# Patient Record
Sex: Male | Born: 1935 | Race: White | Hispanic: No | Marital: Married | Smoking: Current every day smoker
Health system: Southern US, Community
[De-identification: ages and names within clinical notes are randomized; demographics above are authoritative.]

## PROBLEM LIST (undated history)

## (undated) DIAGNOSIS — F0390 Unspecified dementia without behavioral disturbance: Secondary | ICD-10-CM

## (undated) DIAGNOSIS — E119 Type 2 diabetes mellitus without complications: Secondary | ICD-10-CM

## (undated) DIAGNOSIS — G709 Myoneural disorder, unspecified: Secondary | ICD-10-CM

## (undated) DIAGNOSIS — R0600 Dyspnea, unspecified: Secondary | ICD-10-CM

## (undated) DIAGNOSIS — I10 Essential (primary) hypertension: Secondary | ICD-10-CM

## (undated) DIAGNOSIS — M1990 Unspecified osteoarthritis, unspecified site: Secondary | ICD-10-CM

## (undated) DIAGNOSIS — K219 Gastro-esophageal reflux disease without esophagitis: Secondary | ICD-10-CM

## (undated) DIAGNOSIS — J449 Chronic obstructive pulmonary disease, unspecified: Secondary | ICD-10-CM

## (undated) DIAGNOSIS — F209 Schizophrenia, unspecified: Secondary | ICD-10-CM

## (undated) DIAGNOSIS — F329 Major depressive disorder, single episode, unspecified: Secondary | ICD-10-CM

## (undated) DIAGNOSIS — I719 Aortic aneurysm of unspecified site, without rupture: Secondary | ICD-10-CM

## (undated) DIAGNOSIS — I2510 Atherosclerotic heart disease of native coronary artery without angina pectoris: Secondary | ICD-10-CM

## (undated) DIAGNOSIS — Z87442 Personal history of urinary calculi: Secondary | ICD-10-CM

## (undated) DIAGNOSIS — R519 Headache, unspecified: Secondary | ICD-10-CM

## (undated) DIAGNOSIS — F419 Anxiety disorder, unspecified: Secondary | ICD-10-CM

## (undated) DIAGNOSIS — R51 Headache: Secondary | ICD-10-CM

## (undated) DIAGNOSIS — I509 Heart failure, unspecified: Secondary | ICD-10-CM

## (undated) DIAGNOSIS — J189 Pneumonia, unspecified organism: Secondary | ICD-10-CM

## (undated) DIAGNOSIS — F32A Depression, unspecified: Secondary | ICD-10-CM

## (undated) HISTORY — PX: COLONOSCOPY: SHX174

## (undated) HISTORY — DX: Heart failure, unspecified: I50.9

## (undated) HISTORY — PX: FOOT FRACTURE SURGERY: SHX645

## (undated) HISTORY — PX: TONSILLECTOMY: SUR1361

## (undated) HISTORY — PX: VASCULAR SURGERY: SHX849

## (undated) HISTORY — PX: CATARACT EXTRACTION W/ INTRAOCULAR LENS IMPLANT: SHX1309

## (undated) HISTORY — PX: CHOLECYSTECTOMY: SHX55

## (undated) HISTORY — PX: CARDIAC CATHETERIZATION: SHX172

## (undated) HISTORY — PX: ADENOIDECTOMY: SUR15

---

## 1997-11-13 ENCOUNTER — Emergency Department (HOSPITAL_COMMUNITY): Admission: EM | Admit: 1997-11-13 | Discharge: 1997-11-13 | Payer: Self-pay | Admitting: Emergency Medicine

## 1998-05-10 ENCOUNTER — Encounter: Payer: Self-pay | Admitting: General Surgery

## 1998-05-11 ENCOUNTER — Observation Stay (HOSPITAL_COMMUNITY): Admission: RE | Admit: 1998-05-11 | Discharge: 1998-05-12 | Payer: Self-pay | Admitting: General Surgery

## 1999-09-07 ENCOUNTER — Encounter: Payer: Self-pay | Admitting: Emergency Medicine

## 1999-09-07 ENCOUNTER — Emergency Department (HOSPITAL_COMMUNITY): Admission: EM | Admit: 1999-09-07 | Discharge: 1999-09-07 | Payer: Self-pay | Admitting: Emergency Medicine

## 1999-09-14 ENCOUNTER — Ambulatory Visit (HOSPITAL_COMMUNITY): Admission: RE | Admit: 1999-09-14 | Discharge: 1999-09-14 | Payer: Self-pay | Admitting: Family Medicine

## 1999-09-21 ENCOUNTER — Encounter: Payer: Self-pay | Admitting: Cardiovascular Disease

## 1999-09-21 ENCOUNTER — Inpatient Hospital Stay (HOSPITAL_COMMUNITY): Admission: EM | Admit: 1999-09-21 | Discharge: 1999-09-22 | Payer: Self-pay | Admitting: Emergency Medicine

## 2000-01-31 ENCOUNTER — Ambulatory Visit (HOSPITAL_COMMUNITY): Admission: RE | Admit: 2000-01-31 | Discharge: 2000-02-01 | Payer: Self-pay | Admitting: *Deleted

## 2004-06-30 ENCOUNTER — Ambulatory Visit (HOSPITAL_COMMUNITY): Admission: RE | Admit: 2004-06-30 | Discharge: 2004-06-30 | Payer: Self-pay | Admitting: Family Medicine

## 2004-11-02 ENCOUNTER — Emergency Department (HOSPITAL_COMMUNITY): Admission: EM | Admit: 2004-11-02 | Discharge: 2004-11-02 | Payer: Self-pay | Admitting: Emergency Medicine

## 2004-11-05 ENCOUNTER — Emergency Department (HOSPITAL_COMMUNITY): Admission: EM | Admit: 2004-11-05 | Discharge: 2004-11-05 | Payer: Self-pay | Admitting: Emergency Medicine

## 2006-10-06 ENCOUNTER — Encounter: Admission: RE | Admit: 2006-10-06 | Discharge: 2006-10-06 | Payer: Self-pay | Admitting: Family Medicine

## 2007-09-17 ENCOUNTER — Inpatient Hospital Stay (HOSPITAL_COMMUNITY): Admission: EM | Admit: 2007-09-17 | Discharge: 2007-09-19 | Payer: Self-pay | Admitting: Emergency Medicine

## 2007-09-21 ENCOUNTER — Emergency Department (HOSPITAL_BASED_OUTPATIENT_CLINIC_OR_DEPARTMENT_OTHER): Admission: EM | Admit: 2007-09-21 | Discharge: 2007-09-21 | Payer: Self-pay | Admitting: Emergency Medicine

## 2008-04-28 ENCOUNTER — Encounter: Payer: Self-pay | Admitting: Emergency Medicine

## 2008-04-29 ENCOUNTER — Ambulatory Visit: Payer: Self-pay | Admitting: *Deleted

## 2008-04-29 ENCOUNTER — Inpatient Hospital Stay (HOSPITAL_COMMUNITY): Admission: RE | Admit: 2008-04-29 | Discharge: 2008-04-30 | Payer: Self-pay | Admitting: *Deleted

## 2008-05-04 ENCOUNTER — Emergency Department (HOSPITAL_COMMUNITY): Admission: EM | Admit: 2008-05-04 | Discharge: 2008-05-04 | Payer: Self-pay | Admitting: Emergency Medicine

## 2010-07-11 LAB — COMPREHENSIVE METABOLIC PANEL
ALT: 18 U/L (ref 0–53)
AST: 30 U/L (ref 0–37)
Albumin: 3.3 g/dL — ABNORMAL LOW (ref 3.5–5.2)
Alkaline Phosphatase: 81 U/L (ref 39–117)
BUN: 10 mg/dL (ref 6–23)
CO2: 32 mEq/L (ref 19–32)
Calcium: 8.8 mg/dL (ref 8.4–10.5)
Chloride: 99 mEq/L (ref 96–112)
Creatinine, Ser: 1.34 mg/dL (ref 0.4–1.5)
GFR calc Af Amer: >60 mL/min (ref >60)
GFR calc non Af Amer: 52 mL/min — ABNORMAL LOW (ref >60)
Glucose, Bld: 158 mg/dL — ABNORMAL HIGH (ref 70–99)
Potassium: 3.8 mEq/L (ref 3.5–5.1)
Sodium: 138 mEq/L (ref 135–145)
Total Bilirubin: 0.6 mg/dL (ref 0.3–1.2)
Total Protein: 6 g/dL (ref 6.0–8.3)

## 2010-07-11 LAB — DIFFERENTIAL
Band Neutrophils: 0 % (ref 0–10)
Basophils Absolute: 0 10*3/uL (ref 0.0–0.1)
Basophils Absolute: 0.1 10*3/uL (ref 0.0–0.1)
Basophils Relative: 0 % (ref 0–1)
Basophils Relative: 1 % (ref 0–1)
Blasts: 0 %
Eosinophils Absolute: 0.3 10*3/uL (ref 0.0–0.7)
Eosinophils Absolute: 0.4 10*3/uL (ref 0.0–0.7)
Eosinophils Relative: 3 % (ref 0–5)
Eosinophils Relative: 7 % — ABNORMAL HIGH (ref 0–5)
Lymphocytes Relative: 21 % (ref 12–46)
Lymphocytes Relative: 31 % (ref 12–46)
Lymphs Abs: 1.7 10*3/uL (ref 0.7–4.0)
Lymphs Abs: 1.9 10*3/uL (ref 0.7–4.0)
Metamyelocytes Relative: 0 %
Monocytes Absolute: 0.4 10*3/uL (ref 0.1–1.0)
Monocytes Absolute: 0.7 10*3/uL (ref 0.1–1.0)
Monocytes Relative: 8 % (ref 3–12)
Monocytes Relative: 8 % (ref 3–12)
Myelocytes: 0 %
Neutro Abs: 3 10*3/uL (ref 1.7–7.7)
Neutro Abs: 5.9 10*3/uL (ref 1.7–7.7)
Neutrophils Relative %: 54 % (ref 43–77)
Neutrophils Relative %: 66 % (ref 43–77)
Promyelocytes Absolute: 0 %
nRBC: 0 /100 WBC

## 2010-07-11 LAB — BASIC METABOLIC PANEL
BUN: 15 mg/dL (ref 6–23)
CO2: 27 mEq/L (ref 19–32)
Calcium: 10 mg/dL (ref 8.4–10.5)
Chloride: 105 mEq/L (ref 96–112)
Creatinine, Ser: 1.38 mg/dL (ref 0.4–1.5)
GFR calc Af Amer: >60 mL/min (ref >60)
GFR calc non Af Amer: 50 mL/min — ABNORMAL LOW (ref >60)
Glucose, Bld: 166 mg/dL — ABNORMAL HIGH (ref 70–99)
Potassium: 3.7 mEq/L (ref 3.5–5.1)
Sodium: 143 mEq/L (ref 135–145)

## 2010-07-11 LAB — CBC
HCT: 37.7 % — ABNORMAL LOW (ref 39.0–52.0)
HCT: 40.5 % (ref 39.0–52.0)
Hemoglobin: 12.6 g/dL — ABNORMAL LOW (ref 13.0–17.0)
Hemoglobin: 13.8 g/dL (ref 13.0–17.0)
MCHC: 33.3 g/dL (ref 30.0–36.0)
MCHC: 34.2 g/dL (ref 30.0–36.0)
MCV: 82.9 fL (ref 78.0–100.0)
MCV: 84.6 fL (ref 78.0–100.0)
Platelets: 265 10*3/uL (ref 150–400)
Platelets: 301 10*3/uL (ref 150–400)
RBC: 4.46 MIL/uL (ref 4.22–5.81)
RBC: 4.88 MIL/uL (ref 4.22–5.81)
RDW: 13 % (ref 11.5–15.5)
RDW: 14 % (ref 11.5–15.5)
WBC: 5.5 10*3/uL (ref 4.0–10.5)
WBC: 8.9 10*3/uL (ref 4.0–10.5)

## 2010-07-11 LAB — RAPID URINE DRUG SCREEN, HOSP PERFORMED
Amphetamines: NOT DETECTED
Amphetamines: NOT DETECTED
Barbiturates: NOT DETECTED
Barbiturates: NOT DETECTED
Benzodiazepines: POSITIVE — AB
Benzodiazepines: POSITIVE — AB
Cocaine: NOT DETECTED
Cocaine: NOT DETECTED
Opiates: NOT DETECTED
Opiates: NOT DETECTED
Tetrahydrocannabinol: NOT DETECTED
Tetrahydrocannabinol: NOT DETECTED

## 2010-07-11 LAB — URINALYSIS, ROUTINE W REFLEX MICROSCOPIC
Bilirubin Urine: NEGATIVE
Bilirubin Urine: NEGATIVE
Glucose, UA: NEGATIVE mg/dL
Glucose, UA: NEGATIVE mg/dL
Hgb urine dipstick: NEGATIVE
Hgb urine dipstick: NEGATIVE
Ketones, ur: NEGATIVE mg/dL
Ketones, ur: NEGATIVE mg/dL
Nitrite: NEGATIVE
Nitrite: NEGATIVE
Protein, ur: NEGATIVE mg/dL
Protein, ur: NEGATIVE mg/dL
Specific Gravity, Urine: 1.013 (ref 1.005–1.030)
Specific Gravity, Urine: 1.019 (ref 1.005–1.030)
Urobilinogen, UA: 1 mg/dL (ref 0.0–1.0)
Urobilinogen, UA: 1 mg/dL (ref 0.0–1.0)
pH: 6 (ref 5.0–8.0)
pH: 6 (ref 5.0–8.0)

## 2010-07-11 LAB — POCT CARDIAC MARKERS
CKMB, poc: 2.1 ng/mL (ref 1.0–8.0)
Myoglobin, poc: 182 ng/mL (ref 12–200)
Troponin i, poc: <0.05 ng/mL (ref 0.00–0.09)

## 2010-07-11 LAB — ETHANOL
Alcohol, Ethyl (B): 6 mg/dL (ref 0–10)
Alcohol, Ethyl (B): <5 mg/dL (ref 0–10)

## 2010-07-11 LAB — TRICYCLICS SCREEN, URINE: TCA Scrn: NOT DETECTED

## 2010-08-08 NOTE — Discharge Summary (Signed)
NAMEBEATRIZ, SETTLES                    ACCOUNT NO.:  [id number]   MEDICAL RECORD NO.:  [id number]          PATIENT TYPE:  IPS   LOCATION:  0300                          FACILITY:  [facility]   PHYSICIAN:  Margaret A. Scott, N.P.DATE OF BIRTH:  [birth year]   DATE OF ADMISSION:  04/29/2008  DATE OF DISCHARGE:  04/30/2008                               DISCHARGE SUMMARY   IDENTIFYING INFORMATION:  A 77 year old widowed Caucasian male.  This  was a voluntary admission.   HISTORY OF PRESENT ILLNESS:  First inpatient psychiatric admission for  this 64 years old whose wife died last week after three years of chronic  illness, he was the primary caregiver.  Apparently, he had talked with  his home health nurse, who referred him for admission and said that he  wished that he would be with his wife.  He denies that he has had any  plans to actually kill himself.  He admits being upset with his son  because his wife had a do not resuscitate order and that had been  apparently consented to by the son, and then when she had a cardiac  arrest, she was not resuscitated.  He says that he has made statement  that he wishes that he were with her, but says he did not intend that to  mean and that he has plans to kill himself.  He denies any plan to kill  himself or harm himself in any way.  Our clinical social worker have  spoken today with his home healthcare nurse, who was not feeling that he  was acutely suicidal but rather becoming more physically infirm, and  having difficulty caring for himself.  Today, his son agrees to take him  home with him and will make arrangements for him to go to assisted  living.  The patient denies any suicidal thoughts or thoughts of harming  himself in any way.  No history of substance abuse.   PAST PSYCHIATRIC HISTORY:  No history of previous psychiatric treatment.  No history of psychotropic medications.  No history of brain injury or  substance abuse.  No prior outpatient  treatment.   SOCIAL HISTORY:  Retired 77 year old widowed, on 02-03-2024his wife  died after suffering a pancreatic cancer for three years.  The patient  was the primary caregiver.  He has one adult son in town who is  supportive.  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  He is active in the [employer]  here in [place].  No legal problems.  He does have a home health  nurse that visits him, and help organize and track his medications.   MEDICAL HISTORY:  Primary care is at the [facility] in [place].  Medical problems are:  1. Dyslipidemia.  2. Coronary artery disease.  3. Gastroesophageal reflux disease.  4. Kidney stone.  5. Iliac artery aneurysm.  6. Urinary tract infection.  7. Skin lesions, not otherwise specified.   CURRENT MEDICATIONS:  1. Cyanocobalamin, that is, vitamin B12 500 mg two tablets daily.  2. Valacyclovir 500 mg one tablet t.i.d.  3. Temazepam 3 mg p.o. q.h.s.  4. Gabapentin 600 mg b.i.d.  5. Galantamine 4 mg one at bedtime.  6. Gemfibrozil 600 mg b.i.d.  7. Simvastatin 40 mg one-half tablet q.h.s.  8. ASA 81 mg daily.  9. Hydroxyzine 50 mg q.i.d. p.r.n. anxiety.  10.Venlafaxine XR 150 mg q.a.m. and q.h.s.  11.__________ eye drops one drop daily each eye.  12.Xanax 0.5 mg q.i.d. p.r.n. anxiety.  13.Tramadol 50 mg two tablets b.i.d. p.r.n. pain.   DRUG ALLERGIES:  No known drug allergies.   PHYSICAL EXAMINATION:  Physical exam was done in the emergency room.  This is a tall, medium-built Caucasian male in no distress, does  ambulate with a cane but has a stable gait.  Fully independent in all  his activities of daily living.  Vital signs are normal.  Full physical  exam was done in the emergency room and is noted in the record.    DIAGNOSTIC STUDIES:  Revealed normal urinalysis.  Chest x-ray reveals no  acute findings.  Urine drug screen positive for benzodiazepines.  CBC is  unremarkable.  Comprehensive metabolic panel and liver enzymes are  within normal limits.   MENTAL STATUS EXAM:  Fully alert male, pleasant, cooperative, gives a  coherent history, speech is normal, eye contact is good.  He is in full  contact with reality.  Speech is normal and paced tone production in  form.  He gives a coherent history.  Mood is neutral.  Thought process  logical and coherent.  Insight is good.  He reports that he is grieving  the loss of his wife, has no intent to harm himself.  No dangerous ideas  and thinking is linear.  Goal directed appropriate.  His concerns are  appropriate to the situation.  No evidence of psychosis, no delusional  statements made.  Cognition is preserved.  Immediate, recent, and remote  memory are intact.  Insight is good.  Impulse control and judgment  within normal limits.  AXIS I:  Major depression, recurrent, severe.  AXIS II:  No diagnosis.  AXIS III:  Hypertension, dyslipidemia, coronary artery disease.  AXIS IV:  Severe issues with recent death of his wife, in grief  reaction.  AXIS V:  Current 64, past year 62.   Estimated plan is to discharge him today.  His son is in agreement with  this.  He is coming to pick him up and will take him home with him, and  he has agreed to continue his followup with the [facility] in  [place].   No changes were made in his medications.  No new medications are  started.      Margaret A. Lorin Picket, N.P.     MAS/MEDQ  D:  04/30/2008  T:  04/30/2008  Job:  [id number]

## 2010-08-08 NOTE — Discharge Summary (Signed)
Chad Cameron, STRENGTH                    ACCOUNT NO.:  [id number]   MEDICAL RECORD NO.:  [id number]          PATIENT TYPE:  INP   LOCATION:  6730                         FACILITY:  [facility]   PHYSICIAN:  Michelene Gardener, MD    DATE OF BIRTH:  May 04, 1931   DATE OF ADMISSION:  09/17/2007  DATE OF DISCHARGE:  09/19/2007                               DISCHARGE SUMMARY   Her primary physician is Dr. Manson Cameron at [facility].   DISCHARGE DIAGNOSES:  1. Acute renal failure.  2. Orthostatic hypotension.  3. Urinary tract infection.  4. Hyperlipidemia.  5. Depression.  6. Anxiety.  7. History of coronary artery disease.  8. Frequent falls.  9. Hypertension.  10.Gastroesophageal reflux disease.  11.Kidney stones.   DISCHARGE MEDICATIONS:  The patient will be discharged on all  preadmission medications.  He does not know the dosages and he was  instructed to take them the way they were prescribed by his primary  physician.  Those medications include alprazolam, benazepril,  colchicine, doxazosin, gabapentin, metoprolol, Zocor, temazepam, and  Trazodone.   CONSULTATIONS:  None.   PROCEDURES:  None.   Follow-up appointment with primary doctor in 1-2 weeks.   RADIOLOGY STUDIES:  1. Chest x-ray on June 24 showed hyperinflation of the lungs.  2. CT scan of the abdomen without contrast on June 24 showed bilateral      nonobstructive renal calculi, renal aortic ectasia, bilateral lung      nodules including 6-mm right lower lobe lung nodule.  3. CT scan of the pelvis showed aneurysmal dilatation of the proximal      left internal iliac artery which is 1.5 cm.  CT scan of the head      without contrast on June 25 showed no acute findings.   COURSE OF HOSPITALIZATION:  1. Acute renal failure.  This patient had creatinine of 3.2 on the      time of admission.  His renal failure is most likely secondary to      ATN secondary to multiple factors including hypoperfusion from      hypotension in  addition to medications effect including benazepril      and colchicine in addition to UTI.  All those conditions were      treated.  He was given IV fluids to correct his hypotension and to      improve his renal perfusion.  He was given antibiotics to treat his      urinary tract infection.  His benazepril and colchicine were held.      His creatinine function was monitored in the hospital, and at the      time of discharge, it was back to normal at 1.16.  CT scan of the      abdomen and pelvis were done and they showed renal calculi which      has been present for long time without evidence of obstruction.      The patient was instructed to be treated medically for his renal      calculi.  2. Urinary tract infection.  His UA is showing a borderline UTI.  He      was given Rocephin while in the hospital.  He remains afebrile with      normal white counts.  No more antibiotics are recommended at the      time of discharge.  3. Orthostatic hypotension.  At the time of admission, his blood      pressure was in the 70s and he was dehydrated.  Antihypertensive      medications were held.  The patient was put on IV fluids.  At the      time of discharge, his blood pressure improved to 139/80.  That has      been without any antihypertensive medications and on IV fluids.  I      instructed him to stop his antihypertensive medication including      benazepril, metoprolol, and he will be seen and evaluated by his      primary doctor to restart them if appropriate.  4. Frequent falls.  This patient has had falls on and off for the last      2 years.  CT scan of this was done and it came to be normal.  The      patient is currently very normal.  His gait is back to his      baseline.  He was instructed to follow with his doctor.  Seen by PT      in the hospital and no further recommendations on him.  5. Lung nodule.  The patient had bilateral lung nodules seen in the CT      scan of the abdomen  and the largest is around 6 mm.  Recommendation      is to repeat CAT scan for followup in 6 months to 1 year.  The      patient was made aware about that.  6. Left internal iliac artery aneurysm.  The patient has a 1.5      aneurysm.  Again recommendation was to do a CTA.  I spoke to the      patient about this.  He does not want to do it and now he said he      will follow with his doctor to be done.   DISPOSITION:  Otherwise, other medical conditions remained stable in the  hospital.  The patient is in stable condition and does not have any  acute problems.  I walked him in his room and he did fine.  We will  discharge him home today and will hold his benazepril and his metoprolol   Total assessment time is 40 minutes.      Michelene Gardener, MD  Electronically Signed     NAE/MEDQ  D:  09/19/2007  T:  09/19/2007  Job:  [id number]   cc:   Dr. Manson Cameron at [facility]

## 2010-08-08 NOTE — H&P (Signed)
NAMEREACE, BRESHEARS NO.:  [id number]   MEDICAL RECORD NO.:  [id number]          PATIENT TYPE:  EMS   LOCATION:  MAJO                         FACILITY:  [facility]   PHYSICIAN:  Michelene Gardener, MD    DATE OF BIRTH:  [birth year]   DATE OF ADMISSION:  09/17/2007  DATE OF DISCHARGE:                              HISTORY & PHYSICAL   PRIMARY PHYSICIAN:  The patient has been followed at [facility] with  the [facility]. Will be admitted to the hospital as unassigned.   CHIEF COMPLAINT:  Frequent fall and fatigability.   HISTORY OF PRESENT ILLNESS:  This is a 77 year old Caucasian male with  past medical history of coronary artery disease, mild COPD,  hypertension, anxiety and GERD who presented with the above-mentioned  complaint.  Currently, the patient is very agitated. Minimal history can  be taken from him.  He is insisting of taking his Foley with and he does  not give very good history.  As per him, he has been his been having  poor balance for the last 6 months and he had very frequent falls.  In  the last few weeks, he has been falling almost every day, sometimes he  felt twice a day.  Today, this patient fell down. Did not feel any  dizziness or shortness of breath or chest pain prior to his fall.  His  wife called 9-1-1 because his falls have become very, very frequent.  When he came to the ER he was found to be hypotensive and he was also  orthostatic.  Laying down, his blood pressure was 81/49 and when he  stood up his blood pressure was 59/48.  He was also found to be in acute  renal failure.  The hospitalist service was called for further  evaluation.   PAST MEDICAL HISTORY:  1. Coronary artery disease status post cardiac catheterization in 2001      that showed moderate coronary artery disease in mid left anterior      descending artery with 50-60% stenosis.  At that time his ejection      fraction was 55%.  2. Hyperlipidemia.  3. Depression.  4.  Anxiety.  5. Hypertension.  6. Gastroesophageal reflux disease.  7. History of renal stones.   PAST SURGICAL HISTORY:  Denied.   ALLERGIES:  NO KNOWN DRUG ALLERGIES.   CURRENT MEDICATIONS:  The patient knows only the names but does not know  the dosage; medications include:  1. Alprazolam.  2. Benazepril.  3. Colchicine.  4. Doxazosin.  5. Gabapentin.  6. Galantamine.  7. Gemfibrozil.  8. Metoprolol tartrate.  9. Simvastatin.  10.Temazepam.  11.Tramadol/acetaminophen.  12.Trazodone.  13.Valtrex.   SOCIAL HISTORY:  The patient smokes around half pack of cigarettes per  day and has been smoking for a long time. He drinks around 2 ounces of  liquor per day since he cut down on his drinking.   FAMILY HISTORY:  Is positive for father with myocardial infarction.   REVIEW OF SYSTEMS:  As per HPI.   PHYSICAL EXAMINATION:  VITAL SIGNS: Temperature 97.7, blood pressure on  the time of admission is 81/49 laying down and 59/48 standing up, heart  rate 87, respiratory rate 22.  GENERAL APPEARANCE:  This is an elderly Caucasian male not in acute  distress.  HEENT: His conjunctivae are pink.  Pupils are equal, round and reactive  to light.  There is no ptosis. Hearing is intact. There is no ear  discharge or infection.  There is no nose infection or bleeding.  Oral  mucosa is dry.  No pharyngeal erythema.  NECK: Neck is supple.  No JVD, no carotid bruit, no lymphadenopathy and  no  thyroid enlargement or thyroid tenderness.  CARDIOVASCULAR:  S1, S2 regular. There is no murmurs, rubs or thrills.  RESPIRATORY:  The patient is breathing between 16-18.  There is no  rales, rhonchi and wheezes.  ABDOMEN:  Abdomen soft, not distended.  No tenderness. No  hepatosplenomegaly. Bowel sounds are normal.  LOWER EXTREMITIES: No edema, no rash and no varicose veins.  SKIN:  No rash and no erythema.  Skin turgor is very poor suggestive of  dehydration.  NEURO:  Cranial nerves are intact from II  through XII.  There is no  motor or sensory deficits.   LABORATORY DATA:  WBC 4.4, hemoglobin 11.6, hematocrit is 33.6, MCV  92.0, platelet count 138.  INR 1.1, PTT 29.  Lactic acid 1.4.  Sodium  137, potassium 5.1, chloride 108, bicarb 25, creatinine 3.2, glucose 80.  Urinalysis is borderline and it showed trace amount of leukocyte  esterase.  Urine WBC 3-6 and few bacteria.  Chest x-ray showed  hyperinflation with no acute findings.   IMPRESSION:  1. Acute renal failure.  2. Orthostatic hypotension.  3. Urinary tract infection.  4. Hyperlipidemia.  5. Depression.  6. Anxiety.  7. Coronary artery disease.  8. Hypertension.  9. Gastroesophageal reflux disease.  10.History of renal stones.   PLAN:  1. Acute renal failure.  Etiology is unclear at this point.  Might be      a combination of multiple factors.  His BUN over creatinine might      suggestive of post obstructive renal failure.  This patient already      had Foley catheter, but he is fighting to take the Foley out and he      is threatening to get the Foley out and leave the hospital.  I have      prolonged discussion with him to leave the Foley in, but he said      that he has been urinating well and he will not keep it.  I will      discontinue his Foley and will monitor his output very closely.  I      will hold medications that might affect his kidney and those      include benazepril, colchicine. His renal failure might be caused      by acute ATN secondary to hypotension so I will try to hydrate him      with IV fluids and then will follow his basic metabolic panel very      closely.  I will monitor his daily inputs and outputs.  Will get      urine, sodium, potassium, creatinine and protein.  The patient had      a CT of the abdomen and this is pending and I will follow the      results.  If creatinine continued to worsen then will consider  nephrology evaluation.  2. Hypotension, orthostatic hypotension.  I  will stop his      antihypertensive medication.  Will put him on normal saline.  I      will follow his blood pressure very carefully.  3. Urinary tract infection.  His urine is borderline.  Because of his      acute renal failure,  I will treat his urine infection with      Rocephin.  I will also get urine cultures.   DISPOSITION:  The patient does not know his medication dosages.  I will  order them when and they are available.  Otherwise, other medical  condition seems to be stable at the present.  Assessment time is 1 hour.      Michelene Gardener, MD  Electronically Signed     NAE/MEDQ  D:  09/17/2007  T:  09/17/2007  Job:  [id number]

## 2010-08-08 NOTE — Consult Note (Signed)
NAMEYADER, CRIGER                    ACCOUNT NO.:  [id number]   MEDICAL RECORD NO.:  [id number]          PATIENT TYPE:  IPS   LOCATION:  0300                          FACILITY:  [facility]   PHYSICIAN:  Antonietta Breach, M.D.  DATE OF BIRTH:  April 14, 1931   DATE OF CONSULTATION:  04/29/2008  DATE OF DISCHARGE:  04/30/2008                                 CONSULTATION   REQUESTING PHYSICIAN:  [facility].   REASON FOR CONSULTATION:  Psychosis.   HISTORY OF PRESENT ILLNESS:  Mr. Cuyler Vandyken is a 77 year old male who  presented to the [facility] with suicidal ideation.  Mr.  Hitchman has been grieving the loss of his wife.  She died on 05/07/2022 2010.  Since that time, Mr. Ruark has been developing paranoid delusions.  He has believed that there are people in his house who are not there.  He has had bizarre anger towards his son.  He has expressed suicidal  ideation and today has a plan to walk out in front of a car.  He does  have short-term memory impairment as well as impairment in judgment.  He  continues to state that he is going to walk out of the hospital.  He has  impairment in orientation other than place and self.   He has been receiving psychotropic medications as an outpatient  including Xanax 0.25 mg daily; Restoril 30 mg daily; trazodone 150 mg  q.h.s. and venlafaxine 150 mg b.i.d. However, his depression has  continued along with the development of the psychosis above.   PAST PSYCHIATRIC HISTORY:  Mr. Wyne does have a history of depression.  In review of the past medical record in June 2009 during a hospital  admission, depression and anxiety were both listed.  At that time, he  was being treated with Xanax as well as trazodone and temazepam   FAMILY PSYCHIATRIC HISTORY:  None known.   SOCIAL HISTORY:  Mr. Pieper has just become widowed on May 07, 2008.  He has been living by himself in his house.  Please see the above.  He  is not using any alcohol  or illegal drugs.  Occupation:  Retired.  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  He has one son.  He does have a history of drinking liquor in  the past drinking 2 ounces of liquor per day.  Mr. Siefert son has been  trying to get his financial affairs in order.   PAST MEDICAL HISTORY:  1. Coronary artery disease.  2. Hypertension.  3. Gastroesophageal reflux disease.  4. History of kidney stones.  5. Urinary tract infection.   MEDICATIONS:  His MAR is reviewed.  He has been taking the above  psychotropic medications.  However, he has not received any at the  hospital yet.   ALLERGIES:  NO KNOWN DRUG ALLERGIES.   LABORATORY DATA:  Urine drug screen was positive for benzodiazepines  which he is  prescribed.  WBC 5.5, hemoglobin 12.6, platelet count is  265, his MCV is 84.6, alcohol negative.  His SGOT 30, SGPT 18, albumin  3.3.   DIAGNOSTICS:  1. Head CT without contrast in June 2009 did show atrophy and      nonspecific white matter changes.  2. EKG QTC 493 milliseconds on April 28, 2008.   REVIEW OF SYSTEMS:  Constitutional, head, eyes, ears, nose, throat,  mouth, neurologic, psychiatric, cardiovascular, respiratory,  gastrointestinal, genitourinary, skin, musculoskeletal, hematologic,  lymphatic, endocrine, metabolic all unremarkable.   PHYSICAL EXAMINATION:  VITAL SIGNS:  Temperature 97.7, pulse 71,  respiratory rate 20, blood pressure 134/87, O2 saturation on room air  95%.  GENERAL APPEARANCE:  Mr. Mccorkel is an elderly male wandering the hallway  with no abnormal involuntary movements.   MENTAL STATUS EXAM:  Mr. Massman has a very anxious affect.  His mood is  anxious.  He is oriented to person.  He is alert.  His eye contact is  intact.  His attention span is mildly decreased.  Concentration is  mildly decreased.  Memory testing 3/3 immediate, 0/3 at 3 minutes.  His  fund of  knowledge and intelligence are below that of his estimated  premorbid baseline.  His speech involves normal rate and prosody without  dysarthria.  Thought process is coherent, however, illogical.  His  thought content involves paranoia.  He has poor insight and judgment.   ASSESSMENT:  AXIS I:  293.81, psychotic disorder not otherwise  specified.  293.83, mood disorder not otherwise specified, depressed, rule out major  depressive disorder with psychotic features.  Rule out dementia not  otherwise specified.  AXIS II:  None.  AXIS III:  See past medical history.  AXIS IV:  General medical, severe grief.  AXIS V:  20.   Mr. Marsiglia is at risk for suicide.  He also is at risk for lethal self-  neglect given his impaired memory and judgment.   RECOMMENDATIONS:  1. Would admit to a psychiatric inpatient unit as soon as possible for      further evaluation and treatment of his severe depression and      psychosis.  2. Would confirm that a reversible memory dysfunction etiology workup      has been conducted including checking a B12, folic acid, RPR and      TSH.  These have likely been conducted in the past given that he      has been on galantine.  The undersigned will order Ativan one-half      to 2 mg p.o. IM or IV q.4 h. p.r.n. severe agitation.  3. Also the undersigned will write for venlafaxine 37.5 mg b.i.d. to      reduce Mr. Yon withdrawal risk with venlafaxine.  However, will      not plan on increasing this dosage further given that he will      require a modification of his overall psychotropic treatment for      depression as well as treatment for psychosis.  4. The undersigned will defer antipsychotic treatment.  However, would      be cautious when an antipsychotic is      started regarding his QTC, rechecking the QTC after starting an      antipsychotic.  5. Would exercise caution when utilizing the Ativan regarding ataxia      and sedation.  6. Would continue ego  supportive psychotherapy and admit to an      inpatient psychiatric  ward as soon as possible.      Antonietta Breach, M.D.  Electronically Signed     JW/MEDQ  D:  04/30/2008  T:  05/01/2008  Job:  [id number]
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
## 2010-08-11 NOTE — Discharge Summary (Signed)
[place]. [facility]  Patient:    Chad Cameron, Chad Cameron                           MRN: [id number] Adm. Date:  [id number] Disc. Date: [id number] Attending:  Koren Bound CC:         Teena Irani. Arlyce Dice, M.D.             Rudene Christians. Ladona Ridgel, M.D. [facility]                           Discharge Summary  ADMITTING DIAGNOSIS:  Syncope with evidence of ventricular tachycardia.  DISCHARGE DIAGNOSES: 1. History of syncope. 2. Nonsustained ventricular tachycardia. 3. Mild to moderate coronary artery disease with normal left ventricular    systolic function. 4. Hypertension. 5. Anxiety.  DISCHARGE MEDICATIONS: 1. Toprol XL 100 mg a day. 2. Enteric-coated aspirin 325 mg a day. 3. Altace 5 mg a day.  The patient is to take nerve pills and sleeping pills as directed by his medical doctor.  DISPOSITION:  The patient has been instructed to stop smoking.  He is to eat a low-salt diet.  He is to watch his leg for any signs of bleeding.  He is to call Dr. Elease Hashimoto on Monday for a phone check-in.  He will see Dr. Elease Hashimoto for a stress Cardiolite study in two weeks.  HISTORY OF PRESENT ILLNESS:  Chad Cameron is a 77 year old gentleman with a history of syncope approximately two weeks ago.  Workup at that time was unremarkable.  The patient was referred to me for evaluation.  A Holter monitor revealed salvos of nonsustained ventricular tachycardia and he was admitted for further evaluation.  HOSPITAL COURSE: #1 - VENTRICULAR TACHYCARDIA:  The patient was placed on a monitor and had no episodes of ventricular tachycardia throughout the admission.  He remained quite stable.  #2 - QUESTION OF CORONARY ARTERY DISEASE:  The patient underwent heart catheterization.  He was found to have moderate coronary artery irregularities.  He did appear to have a ruptured plaque in the proximal left anterior descending artery that did not appear to be flow obstructive at this time.  He had a moderate 60%  stenosis in the distal LAD.  We will get a stress Cardiolite study to determine the functional status of this stenosis.  The left circumflex artery and the right coronary artery were within normal limits.  His left ventricular systolic function was normal.  There was no evidence of a previous myocardial infarction.  The patient will be discharged on enteric-coated aspirin and Toprol XL.  #3 - HYPERTENSION:  The patients blood pressure was 160/100 today.  We have already started him on Altace 5 mg a day.  We will add Toprol XL 100 mg a day. We will have him hold his Toprol several days prior to the stress test. DD:  09/22/99 TD:  09/23/99 Job: [id number]

## 2010-08-11 NOTE — Cardiovascular Report (Signed)
[place]. [facility]  Patient:    Chad Cameron, Chad Cameron                           MRN: [id number] Proc. Date: 01/31/00 Adm. Date:  [id number] Attending:  Daisey Must CC:         Teena Irani. Arlyce Dice, M.D.             Thomas C. Wall, M.D. [facility]  Cardiac Catheterization Laboratory                        Cardiac Catheterization  PROCEDURES PERFORMED: 1. Left heart catheterization with coronary angiography and left    ventriculography. 2. Intravascular ultrasound of the mid and proximal left anterior descending    artery.  INDICATIONS:  Mr. Giovanetti is a 77 year old male who has been having chest pain. He also had a syncopal episode.  A previous catheterization in June of this year showed moderate disease in the left anterior descending artery.  He has been treated medically but has continued to have symptoms of chest pain occurring on a very frequent basis.  He was therefore referred for repeat catheterization.  DESCRIPTION OF PROCEDURE:  A 6 French sheath was placed in the right femoral artery.  Further diagnostic catheterization we used Standard Judkins 6 French catheters and Omnipaque for contrast.  Following diagnostic catheterization we proceeded with intravascular ultrasound of the left anterior descending artery. We used a 6 [place] EBU 3.5 guiding catheter and a BMW wire. Intravascular ultrasound was performed with a Discovery Plus catheter, both automatic and manual pullback performed and intravascular ultrasound images recorded on video tape.  Intracoronary nitroglycerin was administered prior to performance of the intravascular ultrasound and again after removal of the IVUS catheter and coronary guidewire.  There were no complications.  RESULTS:  HEMODYNAMICS:  Left ventricular pressure 190/28.  Aorticpressure 184/90. There was no aortic valve gradient.  LEFT VENTRICULOGRAM:  Wall motion is normal to mildly hypokinetic globally. Ejection fraction estimated a  55-60%.  No mitral regurgitation.  CORONARYARTERIOGRAPHY:  (Right dominant).  Left main:  Normal.  Left anterior descending:  The left anterior descending artery has what appears to be a 30% stenosis angiographically in the proximal LAD at the bifurcation of a small first diagonal branch.  However, in some views it appears to be a napkin-ring type stenosis which may be more severe than appreciated.  In the mid LAD is a diffuse 40% stenosis.  In the distal LAD at thebifurcation just beyond the bifurcation of this diagonal is a 60% stenosis.  Further down in the distal LAD is a diffuse 60% stenosis.  The LAD gives rise to a small first diagonal, a normal sized second diagonal which shows a 60% stenosis at its origin and a large third diagonal which has a 50% stenosis at its origin.  Left circumflex:  The left circumflexhas a 30% stenosis in the proximal vessel and 20% stenosis in the mid vessel.  It gives rise to a small first marginal branch and a large second marginal branch.  The second marginal has a 30% stenosis.  Rightcoronary artery:  The right coronary artery has a 30% stenosis in the proximal vessel and a diffuse 30% stenosis in the mid vessel.  The distal right coronary artery gives rise to a normal sized posterior descending artery and a small posterolateral branch.  Intravascular ultrasound images of the mid and proximal LAD reveal very  mild atherosclerotic disease in the distal portion of the mid LAD.  In the proximal LAD at the area inquestion which on angiography appears to be possibly a napkin-ring type stenosis.  The lumen by intravascular ultrasound appears to be adequate.The minimal lumen diameter in this area is 3 mm with a reference diameter proximal and distal to this area of 4 to 4.5 mm.  This represents the 25-30% stenosis by intravascular ultrasound.  IMPRESSION: 1. Leftventricular systolic function in the lower range of normal. 2. Moderate one-vessel  coronary artery disease involving the left anterior    descending as described.  There is disease of borderline severity in the    distal left anterior descending involving the bifurcation of the large    third diagonal branch.  The disease in the proximal left anterior    descending does not appear to be hemodynamically significant by    intravascular ultrasound.  RECOMMENDATIONS:  For continued medical therapy. DD:  01/31/00 TD:  02/01/00 Job: [id number]

## 2010-12-21 LAB — CBC
HCT: 33.6 — ABNORMAL LOW
HCT: 43.6
Hemoglobin: 11.6 — ABNORMAL LOW
Hemoglobin: 15
MCHC: 34.5
MCHC: 34.4
MCV: 92
MCV: 90.8
Platelets: 138 — ABNORMAL LOW
Platelets: 222
RBC: 3.65 — ABNORMAL LOW
RBC: 4.8
RDW: 14.6
RDW: 14
WBC: 4.4
WBC: 6.8

## 2010-12-21 LAB — POCT CARDIAC MARKERS
CKMB, poc: 1.5
CKMB, poc: 8.5
CKMB, poc: <1 — ABNORMAL LOW
Myoglobin, poc: 240
Myoglobin, poc: 281
Myoglobin, poc: 61.7
Operator id: 146091
Operator id: 5507
Operator id: 5507
Troponin i, poc: <0.05
Troponin i, poc: <0.05
Troponin i, poc: <0.05

## 2010-12-21 LAB — URINALYSIS, ROUTINE W REFLEX MICROSCOPIC
Bilirubin Urine: NEGATIVE
Glucose, UA: NEGATIVE
Glucose, UA: NEGATIVE
Hgb urine dipstick: NEGATIVE
Hgb urine dipstick: NEGATIVE
Ketones, ur: NEGATIVE
Ketones, ur: 15 — AB
Nitrite: NEGATIVE
Nitrite: NEGATIVE
Protein, ur: NEGATIVE
Protein, ur: NEGATIVE
Specific Gravity, Urine: 1.022
Specific Gravity, Urine: 1.023
Urobilinogen, UA: 1
Urobilinogen, UA: 0.2
pH: 5
pH: 5.5

## 2010-12-21 LAB — BASIC METABOLIC PANEL
BUN: 10
BUN: 19
BUN: 8
CO2: 26
CO2: 28
CO2: 26
Calcium: 9.1
Calcium: 9.5
Calcium: 9.9
Chloride: 109
Chloride: 113 — ABNORMAL HIGH
Chloride: 107
Creatinine, Ser: 1.16
Creatinine, Ser: 1.87 — ABNORMAL HIGH
Creatinine, Ser: 1.2
GFR calc Af Amer: 43 — ABNORMAL LOW
GFR calc Af Amer: >60
GFR calc Af Amer: >60
GFR calc non Af Amer: 36 — ABNORMAL LOW
GFR calc non Af Amer: >60
GFR calc non Af Amer: 59 — ABNORMAL LOW
Glucose, Bld: 101 — ABNORMAL HIGH
Glucose, Bld: 124 — ABNORMAL HIGH
Glucose, Bld: 101 — ABNORMAL HIGH
Potassium: 4.4
Potassium: 5.3 — ABNORMAL HIGH
Potassium: 5.8 — ABNORMAL HIGH
Sodium: 143
Sodium: 147 — ABNORMAL HIGH
Sodium: 144

## 2010-12-21 LAB — DIFFERENTIAL
Basophils Absolute: 0
Basophils Absolute: 0.2 — ABNORMAL HIGH
Basophils Relative: 0
Basophils Relative: 3 — ABNORMAL HIGH
Eosinophils Absolute: 0.3
Eosinophils Absolute: 0.2
Eosinophils Relative: 8 — ABNORMAL HIGH
Eosinophils Relative: 2
Lymphocytes Relative: 20
Lymphocytes Relative: 18
Lymphs Abs: 0.9
Lymphs Abs: 1.2
Monocytes Absolute: 0.4
Monocytes Absolute: 0.5
Monocytes Relative: 10
Monocytes Relative: 7
Neutro Abs: 2.8
Neutro Abs: 4.6
Neutrophils Relative %: 62
Neutrophils Relative %: 70

## 2010-12-21 LAB — POCT I-STAT, CHEM 8
BUN: 25 — ABNORMAL HIGH
Calcium, Ion: 0.98 — ABNORMAL LOW
Chloride: 108
Creatinine, Ser: 3.2 — ABNORMAL HIGH
Glucose, Bld: 80
HCT: 33 — ABNORMAL LOW
Hemoglobin: 11.2 — ABNORMAL LOW
Potassium: 5.1
Sodium: 137
TCO2: 20

## 2010-12-21 LAB — LACTIC ACID, PLASMA: Lactic Acid, Venous: 1.4

## 2010-12-21 LAB — URINE MICROSCOPIC-ADD ON

## 2010-12-21 LAB — APTT: aPTT: 29

## 2010-12-21 LAB — PROTIME-INR
INR: 1.1
Prothrombin Time: 14.7

## 2012-03-03 DIAGNOSIS — G4730 Sleep apnea, unspecified: Secondary | ICD-10-CM | POA: Insufficient documentation

## 2012-03-03 DIAGNOSIS — R739 Hyperglycemia, unspecified: Secondary | ICD-10-CM | POA: Insufficient documentation

## 2012-03-03 DIAGNOSIS — I2510 Atherosclerotic heart disease of native coronary artery without angina pectoris: Secondary | ICD-10-CM | POA: Insufficient documentation

## 2012-03-03 DIAGNOSIS — F32A Depression, unspecified: Secondary | ICD-10-CM | POA: Insufficient documentation

## 2012-03-03 DIAGNOSIS — R55 Syncope and collapse: Secondary | ICD-10-CM | POA: Insufficient documentation

## 2012-03-03 DIAGNOSIS — R51 Headache: Secondary | ICD-10-CM | POA: Insufficient documentation

## 2012-03-03 DIAGNOSIS — N189 Chronic kidney disease, unspecified: Secondary | ICD-10-CM | POA: Insufficient documentation

## 2012-03-03 DIAGNOSIS — I712 Thoracic aortic aneurysm, without rupture, unspecified: Secondary | ICD-10-CM | POA: Insufficient documentation

## 2012-03-03 DIAGNOSIS — M797 Fibromyalgia: Secondary | ICD-10-CM | POA: Insufficient documentation

## 2012-03-03 DIAGNOSIS — E785 Hyperlipidemia, unspecified: Secondary | ICD-10-CM | POA: Insufficient documentation

## 2012-03-03 DIAGNOSIS — N400 Enlarged prostate without lower urinary tract symptoms: Secondary | ICD-10-CM | POA: Insufficient documentation

## 2012-03-03 DIAGNOSIS — J449 Chronic obstructive pulmonary disease, unspecified: Secondary | ICD-10-CM | POA: Insufficient documentation

## 2012-03-03 DIAGNOSIS — F419 Anxiety disorder, unspecified: Secondary | ICD-10-CM | POA: Insufficient documentation

## 2012-03-03 DIAGNOSIS — F0390 Unspecified dementia without behavioral disturbance: Secondary | ICD-10-CM | POA: Insufficient documentation

## 2012-03-03 DIAGNOSIS — R519 Headache, unspecified: Secondary | ICD-10-CM | POA: Insufficient documentation

## 2013-03-05 DIAGNOSIS — M25562 Pain in left knee: Secondary | ICD-10-CM | POA: Insufficient documentation

## 2013-03-05 DIAGNOSIS — M179 Osteoarthritis of knee, unspecified: Secondary | ICD-10-CM | POA: Insufficient documentation

## 2013-03-05 DIAGNOSIS — M1710 Unilateral primary osteoarthritis, unspecified knee: Secondary | ICD-10-CM | POA: Insufficient documentation

## 2014-02-10 DIAGNOSIS — I77810 Thoracic aortic ectasia: Secondary | ICD-10-CM | POA: Insufficient documentation

## 2016-07-16 DIAGNOSIS — E1159 Type 2 diabetes mellitus with other circulatory complications: Secondary | ICD-10-CM | POA: Insufficient documentation

## 2016-07-16 DIAGNOSIS — N521 Erectile dysfunction due to diseases classified elsewhere: Secondary | ICD-10-CM | POA: Insufficient documentation

## 2016-10-01 ENCOUNTER — Other Ambulatory Visit: Payer: Self-pay | Admitting: Neurological Surgery

## 2016-10-09 ENCOUNTER — Encounter (HOSPITAL_COMMUNITY)
Admission: RE | Admit: 2016-10-09 | Discharge: 2016-10-09 | Disposition: A | Discharge status: Home / Self Care | Payer: Medicare Other | Source: Ambulatory Visit | Attending: Neurological Surgery | Admitting: Neurological Surgery

## 2016-10-09 ENCOUNTER — Encounter (HOSPITAL_COMMUNITY): Payer: Self-pay

## 2016-10-09 DIAGNOSIS — E1169 Type 2 diabetes mellitus with other specified complication: Secondary | ICD-10-CM | POA: Insufficient documentation

## 2016-10-09 DIAGNOSIS — G8929 Other chronic pain: Secondary | ICD-10-CM | POA: Insufficient documentation

## 2016-10-09 DIAGNOSIS — E785 Hyperlipidemia, unspecified: Secondary | ICD-10-CM | POA: Diagnosis not present

## 2016-10-09 DIAGNOSIS — M5441 Lumbago with sciatica, right side: Secondary | ICD-10-CM | POA: Diagnosis not present

## 2016-10-09 DIAGNOSIS — Z01818 Encounter for other preprocedural examination: Secondary | ICD-10-CM | POA: Diagnosis not present

## 2016-10-09 HISTORY — DX: Unspecified dementia, unspecified severity, without behavioral disturbance, psychotic disturbance, mood disturbance, and anxiety: F03.90

## 2016-10-09 HISTORY — DX: Anxiety disorder, unspecified: F41.9

## 2016-10-09 HISTORY — DX: Headache: R51

## 2016-10-09 HISTORY — DX: Personal history of urinary calculi: Z87.442

## 2016-10-09 HISTORY — DX: Essential (primary) hypertension: I10

## 2016-10-09 HISTORY — DX: Type 2 diabetes mellitus without complications: E11.9

## 2016-10-09 HISTORY — DX: Chronic obstructive pulmonary disease, unspecified: J44.9

## 2016-10-09 HISTORY — DX: Gastro-esophageal reflux disease without esophagitis: K21.9

## 2016-10-09 HISTORY — DX: Depression, unspecified: F32.A

## 2016-10-09 HISTORY — DX: Headache, unspecified: R51.9

## 2016-10-09 HISTORY — DX: Major depressive disorder, single episode, unspecified: F32.9

## 2016-10-09 HISTORY — DX: Aortic aneurysm of unspecified site, without rupture: I71.9

## 2016-10-09 HISTORY — DX: Myoneural disorder, unspecified: G70.9

## 2016-10-09 HISTORY — DX: Atherosclerotic heart disease of native coronary artery without angina pectoris: I25.10

## 2016-10-09 HISTORY — DX: Unspecified osteoarthritis, unspecified site: M19.90

## 2016-10-09 LAB — BASIC METABOLIC PANEL
Anion gap: 8 (ref 5–15)
BUN: 17 mg/dL (ref 6–20)
CO2: 23 mmol/L (ref 22–32)
Calcium: 9.1 mg/dL (ref 8.9–10.3)
Chloride: 107 mmol/L (ref 101–111)
Creatinine, Ser: 1.13 mg/dL (ref 0.61–1.24)
GFR calc Af Amer: >60 mL/min (ref >60)
GFR calc non Af Amer: 59 mL/min — ABNORMAL LOW (ref >60)
Glucose, Bld: 141 mg/dL — ABNORMAL HIGH (ref 65–99)
Potassium: 4.5 mmol/L (ref 3.5–5.1)
Sodium: 138 mmol/L (ref 135–145)

## 2016-10-09 LAB — CBC WITH DIFFERENTIAL/PLATELET
Basophils Absolute: 0 10*3/uL (ref 0.0–0.1)
Basophils Relative: 0 %
Eosinophils Absolute: 0.1 10*3/uL (ref 0.0–0.7)
Eosinophils Relative: 1 %
HCT: 38 % — ABNORMAL LOW (ref 39.0–52.0)
Hemoglobin: 12.6 g/dL — ABNORMAL LOW (ref 13.0–17.0)
Lymphocytes Relative: 17 %
Lymphs Abs: 1.5 10*3/uL (ref 0.7–4.0)
MCH: 27.5 pg (ref 26.0–34.0)
MCHC: 33.2 g/dL (ref 30.0–36.0)
MCV: 82.8 fL (ref 78.0–100.0)
Monocytes Absolute: 0.4 10*3/uL (ref 0.1–1.0)
Monocytes Relative: 5 %
Neutro Abs: 7.1 10*3/uL (ref 1.7–7.7)
Neutrophils Relative %: 77 %
Platelets: 225 10*3/uL (ref 150–400)
RBC: 4.59 MIL/uL (ref 4.22–5.81)
RDW: 14.6 % (ref 11.5–15.5)
WBC: 9.2 10*3/uL (ref 4.0–10.5)

## 2016-10-09 LAB — GLUCOSE, CAPILLARY: Glucose-Capillary: 154 mg/dL — ABNORMAL HIGH (ref 65–99)

## 2016-10-09 LAB — PROTIME-INR
INR: 1.02
Prothrombin Time: 13.4 seconds (ref 11.4–15.2)

## 2016-10-09 NOTE — Pre-Procedure Instructions (Signed)
Lois HuxleyCoy Pileggi  10/09/2016     No Pharmacies Listed   Your procedure is scheduled on October 11, 2016.  [facility] 1:00 P.M  Call this number if you have problems the morning of surgery:  [phone]   Remember:  Do not eat food or drink liquids after midnight.  Take these medicines the morning of surgery with A SIP OF WATER: Amlodipine (norvasc) Docusate sodium (Colace) Duloxetine (Cymbalta) Gabapentin (Neurontin) Hydrocodone-acetaminophen (Norco/vicodin) Omeprazole (Prilosec) Pantoprazole (Protonix) Pregabalin (Lyrica)  7 days prior to surgery STOP taking any Aspirin, Aleve, Naproxen, Ibuprofen, Motrin, Advil, Goody's, BC's, all herbal medications, fish oil, and all vitamins   How to Manage Your Diabetes Before and After Surgery  Why is it important to control my blood sugar before and after surgery? . Improving blood sugar levels before and after surgery helps healing and can limit problems. . A way of improving blood sugar control is eating a healthy diet by: o  Eating less sugar and carbohydrates o  Increasing activity/exercise o  Talking with your doctor about reaching your blood sugar goals . High blood sugars (greater than 180 mg/dL) can raise your risk of infections and slow your recovery, so you will need to focus on controlling your diabetes during the weeks before surgery. . Make sure that the doctor who takes care of your diabetes knows about your planned surgery including the date and location.  How do I manage my blood sugar before surgery? . Check your blood sugar at least 4 times a day, starting 2 days before surgery, to make sure that the level is not too high or low. o Check your blood sugar the morning of your surgery when you wake up and every 2 hours until you get to the Short Stay unit. . If your blood sugar is less than 70 mg/dL, you will need to treat for low blood sugar: o Do not take insulin. o Treat a low blood sugar  (less than 70 mg/dL) with  cup of clear juice (cranberry or apple), 4 glucose tablets, OR glucose gel. o Recheck blood sugar in 15 minutes after treatment (to make sure it is greater than 70 mg/dL). If your blood sugar is not greater than 70 mg/dL on recheck, call [phone] for further instructions. . Report your blood sugar to the short stay nurse when you get to Short Stay.  . If you are admitted to the hospital after surgery: o Your blood sugar will be checked by the staff and you will probably be given insulin after surgery (instead of oral diabetes medicines) to make sure you have good blood sugar levels. o The goal for blood sugar control after surgery is 80-180 mg/dL.   WHAT DO I DO ABOUT MY DIABETES MEDICATION?   Marland Kitchen. Do not take oral diabetes medicines (pills) the morning of surgery.  . THE NIGHT BEFORE SURGERY, take _25 units of _Lantus__________insulin.      . If your CBG is greater than 220 mg/dL, you may take  of your sliding scale (correction) dose of insulin.  O  Do not wear jewelry, make-up or nail polish.  Do not wear lotions, powders, or perfumes, or deoderant.  Do not shave 48 hours prior to surgery.  Men may shave face and neck.  Do not bring valuables to the hospital.  [facility] is not responsible for any belongings or valuables.  Contacts, dentures or bridgework may not be worn into surgery.  Leave your  suitcase in the car.  After surgery it may be brought to your room.  For patients admitted to the hospital, discharge time will be determined by your treatment team.  Patients discharged the day of surgery will not be allowed to drive home.   Name and phone number of your driver:    Special instructions:   [address]- Preparing For Surgery  Before surgery, you can play an important role. Because skin is not sterile, your skin needs to be as free of germs as possible. You can reduce the number of germs on your skin by washing with CHG (chlorahexidine  gluconate) Soap before surgery.  CHG is an antiseptic cleaner which kills germs and bonds with the skin to continue killing germs even after washing.  Please do not use if you have an allergy to CHG or antibacterial soaps. If your skin becomes reddened/irritated stop using the CHG.  Do not shave (including legs and underarms) for at least 48 hours prior to first CHG shower. It is OK to shave your face.  Please follow these instructions carefully.   1. Shower the NIGHT BEFORE SURGERY and the MORNING OF SURGERY with CHG.   2. If you chose to wash your hair, wash your hair first as usual with your normal shampoo.  3. After you shampoo, rinse your hair and body thoroughly to remove the shampoo.  4. Use CHG as you would any other liquid soap. You can apply CHG directly to the skin and wash gently with a scrungie or a clean washcloth.   5. Apply the CHG Soap to your body ONLY FROM THE NECK DOWN.  Do not use on open wounds or open sores. Avoid contact with your eyes, ears, mouth and genitals (private parts). Wash genitals (private parts) with your normal soap.  6. Wash thoroughly, paying special attention to the area where your surgery will be performed.  7. Thoroughly rinse your body with warm water from the neck down.  8. DO NOT shower/wash with your normal soap after using and rinsing off the CHG Soap.  9. Pat yourself dry with a CLEAN TOWEL.   10. Wear CLEAN PAJAMAS   11. Place CLEAN SHEETS on your bed the night of your first shower and DO NOT SLEEP WITH PETS.    Day of Surgery: Do not apply any deodorants/lotions. Please wear clean clothes to the hospital/surgery center.      Please read over the following fact sheets that you were given. Pain Booklet, MRSA Information and Surgical Site Infection Prevention

## 2016-10-09 NOTE — Progress Notes (Signed)
PCP - Dr. Marina GoodellPerry, Chad DenverZelda Cameron Cardiologist - patient unsure  Chest x-ray - 07/05/2016 EKG - 07/05/2016 Stress Test -05/11/2010  ECHO - 02/10/2017 Cardiac Cath - 02/09/2017  Sleep Study - patient denies  Fasting Blood Sugar - 150-180 now that his insulin regimen was changed, prior it was 250-280 Checks Blood Sugar 1 time a day every 3 days due to supply problems    Patient denies shortness of breath, fever, cough and chest pain at PAT appointment   Patient verbalized understanding of instructions that were given to them at the PAT appointment. Patient was also instructed that they will need to review over the PAT instructions again at home before surgery.

## 2016-10-10 ENCOUNTER — Encounter (HOSPITAL_COMMUNITY): Payer: Self-pay | Admitting: Emergency Medicine

## 2016-10-10 ENCOUNTER — Encounter (HOSPITAL_COMMUNITY): Payer: Self-pay | Admitting: Anesthesiology

## 2016-10-10 LAB — SURGICAL PCR SCREEN
MRSA, PCR: POSITIVE — AB
Staphylococcus aureus: POSITIVE — AB

## 2016-10-10 NOTE — Progress Notes (Addendum)
Anesthesia Chart Review:  Pt is an 83 year old male scheduled for L5-S1 laminectomy and foraminotomy on 10/11/2016 with Marikay Alaravid Jones, MD  - PCP is Bertram DenverZelda Fleming, NP (notes in care everywhere)  - Pt reports has not seen cardiology in years (? last visit 03/22/14 in care everywhere).   PMH includes:  CAD (diffuse disease by 01/2014 cath), dilated aortic root (4.7cm by 02/10/14 echo), HTN, DM, aortic aneurysm (infrarenal 3.7 x 3.6cm 04/16/16), COPD, pulmonary nodules (04/16/16), dementia. GERD.  Current smoker. BMI 25.5  - ED visit 07/05/16 (care everywhere) for right sided chest pain.  Negative troponin, negative d-dimer, no acute findings on EKG, negative CXR.  Felt to be non-cardiac.   Medications include: amlodipine, ASA 81mg , lipitor, aricept, novolog, lantus, lisinopril, prilosec, protonix, sildenafil - Pt's PCP notes lists pt is taking plavix but he denies this. Uncertain when he stopped taking it.   BP 136/65   Pulse 96   Temp 36.4 C   Resp 20   Ht 6' (1.829 m)   Wt 188 lb 12.8 oz (85.6 kg)   SpO2 95%   BMI 25.61 kg/m    Preoperative labs reviewed.  Glucose 141. HbA1c was 9.6 on 09/28/16 (care everywhere).   1 view CXR 07/05/16 (care everywhere): No acute cardiopulmonary process. Unchanged mildly enlarged cardiomediastinal silhouette  EKG 07/05/16 [facility]): NSR. LAD. Incomplete RBBB. Nonspecific ST abnormality.  CT angio aortogram 04/16/16 (care everywhere):   1. Small infrarenal abdominal aortic aneurysm measuring 3.7 x 3.6 cm in the corrected plane with no complicating features. Mild aneurysmal dilatation right common iliac artery and left internal iliac artery with no acute abnormalities. 2. 50% stenosis proximal left main renal artery. 3. Bilateral renal cysts, tiny nonobstructing right upper pole renal stones, duodenal diverticula, colonic diverticulosis, hiatal hernia, spondylosis, mild compression T12 vertebral body, and right inguinal hernia again  noted. 4. 7 mm nodule right lung base and 4 mm nodule left lung base.  - I do not see that PCP is aware of CT angio results including lung nodules that need close f/u.    Echo 02/10/14 (care everywhere): 1. LV size normal. LV systolic function is low normal. EF 50-55%. LV filling pattern is impaired.  2. RV is normal in size and function. 3. There is aortic valve sclerosis. Mild aortic regurgitation. 4. Mildly dilated aortic arch. 5. Mild to moderately dilated aortic root-4.7cm.  significant stenosis seen 6. There is no pericardial effusion.  Cardiac cath 02/09/14 (care everywhere):  1. Mid LAD 70% stenosis. D2 90% stenosis. D3 90% stenosis. 2. OM 2 30% stenosis 3. Mid RCA 50% stenosis. Ectatic aneurysm distal to the 50% lesion. CONCLUSIONS: obstructive 1 vessel. EF 55%. Wall motion normal.  Other notes: LCP for mild Tn leak and SOB. Markedly hypertensive to near 200's throughout case despite Hydralazine. 70% mLAD at bifurcataion of D3 which is a significant vessel - that D3 has 90% far distal. Moderately calcified.90% D2 but small vessel, mild non-obs LCX/OM disease. RCA ectasia with aneurysm distal to 50% lesion.LVEF 55%. EBL 10mL, No specimens. TR for hemostasis.Lesion is notably calcific, and appears chronic in nature. Diffuse disease - not clear that this is the source of his symptoms. Suggest aggressive medical therapy, and if cont'd sx, can consider PCI at a future date.  Reviewed case with Dr. Noreene LarssonJoslin.  Pt will need stress test prior to surgery. I notified Nikki in Dr. Yetta BarreJones' office.   I also notified Stacy in PCP's office that pt is not on  plavix as prescribed and has pulmonary nodules on CT from 04/16/16 have not received f/u as recommended (as far as I can determine).   Rica Mast, FNP-BC [facility]/Anesthesiology Phone: [phone] 10/10/2016 11:24 AM

## 2016-10-11 ENCOUNTER — Encounter (HOSPITAL_COMMUNITY): Admission: RE | Payer: Self-pay | Source: Ambulatory Visit

## 2016-10-11 ENCOUNTER — Inpatient Hospital Stay (HOSPITAL_COMMUNITY): Admission: RE | Admit: 2016-10-11 | Payer: Medicare Other | Source: Ambulatory Visit | Admitting: Neurological Surgery

## 2016-10-11 SURGERY — LUMBAR LAMINECTOMY/DECOMPRESSION MICRODISCECTOMY 1 LEVEL
Anesthesia: General | Site: Back | Laterality: Right

## 2016-10-31 ENCOUNTER — Other Ambulatory Visit: Payer: Self-pay | Admitting: Neurological Surgery

## 2016-11-01 ENCOUNTER — Encounter (HOSPITAL_COMMUNITY): Payer: Self-pay | Admitting: *Deleted

## 2016-11-01 ENCOUNTER — Other Ambulatory Visit: Payer: Self-pay | Admitting: Neurological Surgery

## 2016-11-01 MED ORDER — DEXAMETHASONE SODIUM PHOSPHATE 10 MG/ML IJ SOLN
10.0000 mg | INTRAMUSCULAR | Status: AC
  Administered 2016-11-02: 10 mg via INTRAVENOUS
  Filled 2016-11-01: qty 1

## 2016-11-01 MED ORDER — CEFAZOLIN SODIUM-DEXTROSE 2-4 GM/100ML-% IV SOLN
2.0000 g | INTRAVENOUS | Status: AC
  Administered 2016-11-02: 2 g via INTRAVENOUS
  Filled 2016-11-01: qty 100

## 2016-11-01 NOTE — Progress Notes (Signed)
Anesthesia Chart Review:  Pt is a same day work up.   Pt is an 83 year old male scheduled for L5-S1 laminectomy and foraminotomy on 11/02/2016 with Marikay Alaravid Jones, MD  Surgery was originally scheduled for 10/11/2016, pt was postponed so pt could be evaluated by cardiology prior to surgery (cardiology follow up was overdue by 3 years).   - PCP is Bertram DenverZelda Fleming, NP (notes in care everywhere)  - Cardiologist is W. Viann FishSpencer Tilley, MD, who cleared pt for surgery at last office visit 10/15/16  PMH includes:  CAD (diffuse disease by 01/2014 cath), dilated aortic root (4.7cm by 02/10/14 echo), HTN, DM, aortic aneurysm (infrarenal 3.7 x 3.6cm 04/16/16), COPD, pulmonary nodules (04/16/16), dementia. GERD.  Current smoker. BMI 25.5  - ED visit 07/05/16 (care everywhere) for right sided chest pain.  Negative troponin, negative d-dimer, no acute findings on EKG, negative CXR.  Felt to be non-cardiac.   Medications include: amlodipine, ASA 81mg , lipitor, aricept, novolog, lantus, lisinopril, prilosec, protonix, sildenafil   Preoperative labs will be obtained DOS.  HbA1c was 9.6 on 09/28/16 (care everywhere).   1 view CXR 07/05/16 (care everywhere): No acute cardiopulmonary process. Unchanged mildly enlarged cardiomediastinal silhouette  EKG 07/05/16 [facility]): NSR. LAD. Incomplete RBBB. Nonspecific ST abnormality.  CT angio aortogram 04/16/16 (care everywhere):   1. Small infrarenal abdominal aortic aneurysm measuring 3.7 x 3.6 cm in the corrected plane with no complicating features. Mild aneurysmal dilatation right common iliac artery and left internal iliac artery with no acute abnormalities. 2. 50% stenosis proximal left main renal artery. 3. Bilateral renal cysts, tiny nonobstructing right upper pole renal stones, duodenal diverticula, colonic diverticulosis, hiatal hernia, spondylosis, mild compression T12 vertebral body, and right inguinal hernia again noted. 4. 7 mm nodule  right lung base and 4 mm nodule left lung base.  - I do not see that PCP is aware of CT angio results including lung nodules that need close f/u.    Echo 02/10/14 (care everywhere): 1. LV size normal. LV systolic function is low normal. EF 50-55%. LV filling pattern is impaired.  2. RV is normal in size and function. 3. There is aortic valve sclerosis. Mild aortic regurgitation. 4. Mildly dilated aortic arch. 5. Mild to moderately dilated aortic root-4.7cm.  significant stenosis seen 6. There is no pericardial effusion.  Cardiac cath 02/09/14 (care everywhere):  1. Mid LAD 70% stenosis. D2 90% stenosis. D3 90% stenosis. 2. OM 2 30% stenosis 3. Mid RCA 50% stenosis. Ectatic aneurysm distal to the 50% lesion. CONCLUSIONS: obstructive 1 vessel. EF 55%. Wall motion normal.  Other notes: LCP for mild Tn leak and SOB. Markedly hypertensive to near 200's throughout case despite Hydralazine. 70% mLAD at bifurcataion of D3 which is a significant vessel - that D3 has 90% far distal. Moderately calcified.90% D2 but small vessel, mild non-obs LCX/OM disease. RCA ectasia with aneurysm distal to 50% lesion.LVEF 55%. EBL 10mL, No specimens. TR for hemostasis.Lesion is notably calcific, and appears chronic in nature. Diffuse disease - not clear that this is the source of his symptoms. Suggest aggressive medical therapy, and if cont'd sx, can consider PCI at a future date.  If labs acceptable DOS, I anticipate pt can proceed with surgery as scheduled.   Rica Mastngela Princella Jaskiewicz, FNP-BC [facility]/Anesthesiology Phone: [phone] 11/01/2016 10:16 AM

## 2016-11-01 NOTE — Progress Notes (Signed)
Pt denies SOB, chest pain, and being under the care of a cardiologist. Pt stated that Dr. Yetta BarreJones has a copy of the stress test; records requested. Pt stated that he has the pre-op instructions that were given to him at his PAT, nothing has changed and his medications have already been separated. Pt made aware to stop taking  Aspirin, vitamins, fish oil and herbal medications. Do not take any NSAIDs ie: Ibuprofen, Advil, Naproxen (Aleve/Anaprox), Motrin, BC and Goody Powder or any medication containing Aspirin. Pt made aware to take only half dose of Lantus insulin tonight (25 units). Pt made aware to check BG every 2 hours prior to arrival to hospital on DOS. Pt made aware to treat a BG < 70 with 4 ounces of apple or cranberry juice, wait 15 minutes after intervention to recheck BG, if BG remains < 70, call Short Stay unit to speak with a nurse. Pt verbalized understanding of all pre-op instructions. Anesthesia made aware of the stress test results and EKG tracing request.

## 2016-11-02 ENCOUNTER — Inpatient Hospital Stay (HOSPITAL_COMMUNITY): Payer: Medicare Other | Admitting: Emergency Medicine

## 2016-11-02 ENCOUNTER — Encounter (HOSPITAL_COMMUNITY)
Admission: RE | Disposition: A | Discharge status: Home / Self Care | Payer: Self-pay | Source: Ambulatory Visit | Attending: Neurological Surgery

## 2016-11-02 ENCOUNTER — Encounter (HOSPITAL_COMMUNITY): Payer: Self-pay | Admitting: Neurological Surgery

## 2016-11-02 ENCOUNTER — Inpatient Hospital Stay (HOSPITAL_COMMUNITY): Payer: Medicare Other

## 2016-11-02 ENCOUNTER — Observation Stay (HOSPITAL_COMMUNITY)
Admission: RE | Admit: 2016-11-02 | Discharge: 2016-11-03 | Disposition: A | Discharge status: Home / Self Care | Payer: Medicare Other | Source: Ambulatory Visit | Attending: Neurological Surgery | Admitting: Neurological Surgery

## 2016-11-02 DIAGNOSIS — I10 Essential (primary) hypertension: Secondary | ICD-10-CM | POA: Insufficient documentation

## 2016-11-02 DIAGNOSIS — E1140 Type 2 diabetes mellitus with diabetic neuropathy, unspecified: Secondary | ICD-10-CM | POA: Diagnosis not present

## 2016-11-02 DIAGNOSIS — M79604 Pain in right leg: Secondary | ICD-10-CM | POA: Diagnosis present

## 2016-11-02 DIAGNOSIS — Z419 Encounter for procedure for purposes other than remedying health state, unspecified: Secondary | ICD-10-CM

## 2016-11-02 DIAGNOSIS — F17290 Nicotine dependence, other tobacco product, uncomplicated: Secondary | ICD-10-CM | POA: Diagnosis not present

## 2016-11-02 DIAGNOSIS — F0390 Unspecified dementia without behavioral disturbance: Secondary | ICD-10-CM | POA: Insufficient documentation

## 2016-11-02 DIAGNOSIS — J449 Chronic obstructive pulmonary disease, unspecified: Secondary | ICD-10-CM | POA: Insufficient documentation

## 2016-11-02 DIAGNOSIS — I719 Aortic aneurysm of unspecified site, without rupture: Secondary | ICD-10-CM | POA: Insufficient documentation

## 2016-11-02 DIAGNOSIS — Z794 Long term (current) use of insulin: Secondary | ICD-10-CM | POA: Insufficient documentation

## 2016-11-02 DIAGNOSIS — M4807 Spinal stenosis, lumbosacral region: Secondary | ICD-10-CM | POA: Diagnosis not present

## 2016-11-02 DIAGNOSIS — Z79891 Long term (current) use of opiate analgesic: Secondary | ICD-10-CM | POA: Diagnosis not present

## 2016-11-02 DIAGNOSIS — I2510 Atherosclerotic heart disease of native coronary artery without angina pectoris: Secondary | ICD-10-CM | POA: Insufficient documentation

## 2016-11-02 DIAGNOSIS — F329 Major depressive disorder, single episode, unspecified: Secondary | ICD-10-CM | POA: Insufficient documentation

## 2016-11-02 DIAGNOSIS — Z79899 Other long term (current) drug therapy: Secondary | ICD-10-CM | POA: Insufficient documentation

## 2016-11-02 DIAGNOSIS — Z9889 Other specified postprocedural states: Secondary | ICD-10-CM

## 2016-11-02 DIAGNOSIS — K219 Gastro-esophageal reflux disease without esophagitis: Secondary | ICD-10-CM | POA: Insufficient documentation

## 2016-11-02 DIAGNOSIS — Z7982 Long term (current) use of aspirin: Secondary | ICD-10-CM | POA: Diagnosis not present

## 2016-11-02 HISTORY — PX: LUMBAR LAMINECTOMY/DECOMPRESSION MICRODISCECTOMY: SHX5026

## 2016-11-02 LAB — GLUCOSE, CAPILLARY
Glucose-Capillary: 100 mg/dL — ABNORMAL HIGH (ref 65–99)
Glucose-Capillary: 95 mg/dL (ref 65–99)
Glucose-Capillary: 178 mg/dL — ABNORMAL HIGH (ref 65–99)
Glucose-Capillary: 276 mg/dL — ABNORMAL HIGH (ref 65–99)

## 2016-11-02 LAB — CBC
HCT: 37.8 % — ABNORMAL LOW (ref 39.0–52.0)
Hemoglobin: 12.1 g/dL — ABNORMAL LOW (ref 13.0–17.0)
MCH: 26.6 pg (ref 26.0–34.0)
MCHC: 32 g/dL (ref 30.0–36.0)
MCV: 83.1 fL (ref 78.0–100.0)
Platelets: 208 10*3/uL (ref 150–400)
RBC: 4.55 MIL/uL (ref 4.22–5.81)
RDW: 14.7 % (ref 11.5–15.5)
WBC: 7.4 10*3/uL (ref 4.0–10.5)

## 2016-11-02 LAB — PROTIME-INR
INR: 1.01
Prothrombin Time: 13.4 seconds (ref 11.4–15.2)

## 2016-11-02 LAB — BASIC METABOLIC PANEL
Anion gap: 8 (ref 5–15)
BUN: 8 mg/dL (ref 6–20)
CO2: 26 mmol/L (ref 22–32)
Calcium: 8.6 mg/dL — ABNORMAL LOW (ref 8.9–10.3)
Chloride: 105 mmol/L (ref 101–111)
Creatinine, Ser: 1.15 mg/dL (ref 0.61–1.24)
GFR calc Af Amer: >60 mL/min (ref >60)
GFR calc non Af Amer: 57 mL/min — ABNORMAL LOW (ref >60)
Glucose, Bld: 94 mg/dL (ref 65–99)
Potassium: 4.5 mmol/L (ref 3.5–5.1)
Sodium: 139 mmol/L (ref 135–145)

## 2016-11-02 SURGERY — LUMBAR LAMINECTOMY/DECOMPRESSION MICRODISCECTOMY 1 LEVEL
Anesthesia: General | Site: Back | Laterality: Right

## 2016-11-02 MED ORDER — LIDOCAINE HCL (CARDIAC) 20 MG/ML IV SOLN
INTRAVENOUS | Status: DC | PRN
  Administered 2016-11-02: 30 mg via INTRAVENOUS

## 2016-11-02 MED ORDER — ASPIRIN EC 81 MG PO TBEC
81.0000 mg | DELAYED_RELEASE_TABLET | Freq: Every day | ORAL | Status: DC
  Administered 2016-11-02: 81 mg via ORAL
  Filled 2016-11-02: qty 1

## 2016-11-02 MED ORDER — HEMOSTATIC AGENTS (NO CHARGE) OPTIME
TOPICAL | Status: DC | PRN
  Administered 2016-11-02 (×2): 1 via TOPICAL

## 2016-11-02 MED ORDER — SENNA 8.6 MG PO TABS
1.0000 | ORAL_TABLET | Freq: Two times a day (BID) | ORAL | Status: DC
  Administered 2016-11-02: 8.6 mg via ORAL
  Filled 2016-11-02: qty 1

## 2016-11-02 MED ORDER — INSULIN ASPART 100 UNIT/ML ~~LOC~~ SOLN: 0.0000 [IU] | Freq: Three times a day (TID) | SUBCUTANEOUS | Status: DC

## 2016-11-02 MED ORDER — BUPIVACAINE HCL (PF) 0.25 % IJ SOLN
INTRAMUSCULAR | Status: DC | PRN
  Administered 2016-11-02: 2 mL

## 2016-11-02 MED ORDER — ACETAMINOPHEN 650 MG RE SUPP: 650.0000 mg | RECTAL | Status: DC | PRN

## 2016-11-02 MED ORDER — THROMBIN 5000 UNITS EX SOLR
OROMUCOSAL | Status: DC | PRN
  Administered 2016-11-02: 15:00:00 via TOPICAL

## 2016-11-02 MED ORDER — LACTATED RINGERS IV SOLN
INTRAVENOUS | Status: DC
  Administered 2016-11-02 (×2): via INTRAVENOUS

## 2016-11-02 MED ORDER — GABAPENTIN 300 MG PO CAPS
300.0000 mg | ORAL_CAPSULE | Freq: Three times a day (TID) | ORAL | Status: DC
  Administered 2016-11-02 (×2): 300 mg via ORAL
  Filled 2016-11-02: qty 1

## 2016-11-02 MED ORDER — POTASSIUM CHLORIDE IN NACL 20-0.9 MEQ/L-% IV SOLN: INTRAVENOUS | Status: DC

## 2016-11-02 MED ORDER — PHENYLEPHRINE HCL 10 MG/ML IJ SOLN
INTRAMUSCULAR | Status: DC | PRN
  Administered 2016-11-02: 35 ug/min via INTRAVENOUS

## 2016-11-02 MED ORDER — MIDAZOLAM HCL 2 MG/2ML IJ SOLN: 0.5000 mg | Freq: Once | INTRAMUSCULAR | Status: DC | PRN

## 2016-11-02 MED ORDER — SODIUM CHLORIDE 0.9 % IR SOLN
Status: DC | PRN
  Administered 2016-11-02: 15:00:00

## 2016-11-02 MED ORDER — DULOXETINE HCL 30 MG PO CPEP
60.0000 mg | ORAL_CAPSULE | Freq: Every day | ORAL | Status: DC
  Administered 2016-11-02: 60 mg via ORAL
  Filled 2016-11-02: qty 2

## 2016-11-02 MED ORDER — DONEPEZIL HCL 5 MG PO TABS
5.0000 mg | ORAL_TABLET | Freq: Every day | ORAL | Status: DC
  Administered 2016-11-02: 5 mg via ORAL
  Filled 2016-11-02: qty 1

## 2016-11-02 MED ORDER — MIDAZOLAM HCL 2 MG/2ML IJ SOLN
INTRAMUSCULAR | Status: AC
  Filled 2016-11-02: qty 2

## 2016-11-02 MED ORDER — SODIUM CHLORIDE 0.9% FLUSH: 3.0000 mL | INTRAVENOUS | Status: DC | PRN

## 2016-11-02 MED ORDER — THROMBIN 5000 UNITS EX SOLR
CUTANEOUS | Status: AC
  Filled 2016-11-02: qty 15000

## 2016-11-02 MED ORDER — METHOCARBAMOL 500 MG PO TABS
500.0000 mg | ORAL_TABLET | Freq: Four times a day (QID) | ORAL | Status: DC | PRN
  Administered 2016-11-02 – 2016-11-03 (×2): 500 mg via ORAL
  Filled 2016-11-02 (×2): qty 1

## 2016-11-02 MED ORDER — CELECOXIB 200 MG PO CAPS
200.0000 mg | ORAL_CAPSULE | Freq: Two times a day (BID) | ORAL | Status: DC
  Administered 2016-11-02: 200 mg via ORAL
  Filled 2016-11-02: qty 1

## 2016-11-02 MED ORDER — MEPERIDINE HCL 25 MG/ML IJ SOLN: 6.2500 mg | INTRAMUSCULAR | Status: DC | PRN

## 2016-11-02 MED ORDER — FINASTERIDE 5 MG PO TABS
5.0000 mg | ORAL_TABLET | Freq: Every day | ORAL | Status: DC
  Administered 2016-11-02: 5 mg via ORAL
  Filled 2016-11-02: qty 1

## 2016-11-02 MED ORDER — SUGAMMADEX SODIUM 200 MG/2ML IV SOLN
INTRAVENOUS | Status: DC | PRN
  Administered 2016-11-02: 200 mg via INTRAVENOUS

## 2016-11-02 MED ORDER — THROMBIN 5000 UNITS EX SOLR
CUTANEOUS | Status: DC | PRN
  Administered 2016-11-02 (×2): 5000 [IU] via TOPICAL

## 2016-11-02 MED ORDER — EPHEDRINE SULFATE-NACL 50-0.9 MG/10ML-% IV SOSY
PREFILLED_SYRINGE | INTRAVENOUS | Status: DC | PRN
  Administered 2016-11-02 (×2): 10 mg via INTRAVENOUS

## 2016-11-02 MED ORDER — FENTANYL CITRATE (PF) 250 MCG/5ML IJ SOLN
INTRAMUSCULAR | Status: AC
  Filled 2016-11-02: qty 5

## 2016-11-02 MED ORDER — PROPOFOL 10 MG/ML IV BOLUS
INTRAVENOUS | Status: AC
  Filled 2016-11-02: qty 20

## 2016-11-02 MED ORDER — FENTANYL CITRATE (PF) 100 MCG/2ML IJ SOLN
INTRAMUSCULAR | Status: DC | PRN
  Administered 2016-11-02: 250 ug via INTRAVENOUS

## 2016-11-02 MED ORDER — MORPHINE SULFATE (PF) 4 MG/ML IV SOLN: 2.0000 mg | INTRAVENOUS | Status: DC | PRN

## 2016-11-02 MED ORDER — PANTOPRAZOLE SODIUM 40 MG PO TBEC
40.0000 mg | DELAYED_RELEASE_TABLET | Freq: Every day | ORAL | Status: DC
Start: 2016-11-02 — End: 2016-11-03
  Administered 2016-11-02: 40 mg via ORAL
  Filled 2016-11-02: qty 1

## 2016-11-02 MED ORDER — LISINOPRIL 20 MG PO TABS
40.0000 mg | ORAL_TABLET | Freq: Every day | ORAL | Status: DC
  Administered 2016-11-02: 40 mg via ORAL
  Filled 2016-11-02: qty 2

## 2016-11-02 MED ORDER — CEFAZOLIN SODIUM-DEXTROSE 2-4 GM/100ML-% IV SOLN
2.0000 g | Freq: Three times a day (TID) | INTRAVENOUS | Status: AC
  Administered 2016-11-02 – 2016-11-03 (×2): 2 g via INTRAVENOUS
  Filled 2016-11-02 (×2): qty 100

## 2016-11-02 MED ORDER — HYDROCODONE-ACETAMINOPHEN 7.5-325 MG PO TABS
1.0000 | ORAL_TABLET | Freq: Four times a day (QID) | ORAL | Status: DC
  Administered 2016-11-02 – 2016-11-03 (×3): 1 via ORAL
  Filled 2016-11-02 (×3): qty 1

## 2016-11-02 MED ORDER — 0.9 % SODIUM CHLORIDE (POUR BTL) OPTIME
TOPICAL | Status: DC | PRN
  Administered 2016-11-02: 1000 mL

## 2016-11-02 MED ORDER — HYDROMORPHONE HCL 1 MG/ML IJ SOLN: 0.2500 mg | INTRAMUSCULAR | Status: DC | PRN

## 2016-11-02 MED ORDER — METHOCARBAMOL 1000 MG/10ML IJ SOLN
500.0000 mg | Freq: Four times a day (QID) | INTRAVENOUS | Status: DC | PRN
  Filled 2016-11-02: qty 5

## 2016-11-02 MED ORDER — ONDANSETRON HCL 4 MG/2ML IJ SOLN
INTRAMUSCULAR | Status: DC | PRN
  Administered 2016-11-02: 4 mg via INTRAVENOUS

## 2016-11-02 MED ORDER — CHLORHEXIDINE GLUCONATE CLOTH 2 % EX PADS: 6.0000 | MEDICATED_PAD | Freq: Once | CUTANEOUS | Status: DC

## 2016-11-02 MED ORDER — PHENOL 1.4 % MT LIQD
1.0000 | OROMUCOSAL | Status: DC | PRN
  Administered 2016-11-03: 1 via OROMUCOSAL
  Filled 2016-11-02: qty 177

## 2016-11-02 MED ORDER — PROMETHAZINE HCL 25 MG/ML IJ SOLN: 6.2500 mg | INTRAMUSCULAR | Status: DC | PRN

## 2016-11-02 MED ORDER — SODIUM CHLORIDE 0.9% FLUSH
3.0000 mL | Freq: Two times a day (BID) | INTRAVENOUS | Status: DC
  Administered 2016-11-02: 3 mL via INTRAVENOUS

## 2016-11-02 MED ORDER — AMLODIPINE BESYLATE 5 MG PO TABS
5.0000 mg | ORAL_TABLET | Freq: Every day | ORAL | Status: DC
  Filled 2016-11-02: qty 1

## 2016-11-02 MED ORDER — ROCURONIUM BROMIDE 100 MG/10ML IV SOLN
INTRAVENOUS | Status: DC | PRN
  Administered 2016-11-02: 50 mg via INTRAVENOUS

## 2016-11-02 MED ORDER — MENTHOL 3 MG MT LOZG: 1.0000 | LOZENGE | OROMUCOSAL | Status: DC | PRN

## 2016-11-02 MED ORDER — PREGABALIN 75 MG PO CAPS
150.0000 mg | ORAL_CAPSULE | Freq: Two times a day (BID) | ORAL | Status: DC
  Administered 2016-11-02: 150 mg via ORAL
  Filled 2016-11-02: qty 2

## 2016-11-02 MED ORDER — ONDANSETRON HCL 4 MG PO TABS: 4.0000 mg | ORAL_TABLET | Freq: Four times a day (QID) | ORAL | Status: DC | PRN

## 2016-11-02 MED ORDER — ONDANSETRON HCL 4 MG/2ML IJ SOLN: 4.0000 mg | Freq: Four times a day (QID) | INTRAMUSCULAR | Status: DC | PRN

## 2016-11-02 MED ORDER — TRAZODONE HCL 150 MG PO TABS
150.0000 mg | ORAL_TABLET | Freq: Every day | ORAL | Status: DC
  Administered 2016-11-02: 150 mg via ORAL
  Filled 2016-11-02: qty 1

## 2016-11-02 MED ORDER — PROPOFOL 10 MG/ML IV BOLUS
INTRAVENOUS | Status: DC | PRN
  Administered 2016-11-02: 140 mg via INTRAVENOUS

## 2016-11-02 MED ORDER — ACETAMINOPHEN 325 MG PO TABS: 650.0000 mg | ORAL_TABLET | ORAL | Status: DC | PRN

## 2016-11-02 MED ORDER — GLYCOPYRROLATE 0.2 MG/ML IJ SOLN
INTRAMUSCULAR | Status: DC | PRN
  Administered 2016-11-02: 0.2 mg via INTRAVENOUS

## 2016-11-02 SURGICAL SUPPLY — 57 items
ADH SKN CLS APL DERMABOND .7 (GAUZE/BANDAGES/DRESSINGS) ×1
APL SKNCLS STERI-STRIP NONHPOA (GAUZE/BANDAGES/DRESSINGS) ×1
BAG DECANTER FOR FLEXI CONT (MISCELLANEOUS) ×2 IMPLANT
BENZOIN TINCTURE PRP APPL 2/3 (GAUZE/BANDAGES/DRESSINGS) ×2 IMPLANT
BUR MATCHSTICK NEURO 3.0 LAGG (BURR) ×2 IMPLANT
CANISTER SUCT 3000ML PPV (MISCELLANEOUS) ×2 IMPLANT
CARTRIDGE OIL MAESTRO DRILL (MISCELLANEOUS) ×1 IMPLANT
DERMABOND ADVANCED (GAUZE/BANDAGES/DRESSINGS) ×1
DERMABOND ADVANCED .7 DNX12 (GAUZE/BANDAGES/DRESSINGS) IMPLANT
DIFFUSER DRILL AIR PNEUMATIC (MISCELLANEOUS) ×2 IMPLANT
DRAPE LAPAROTOMY 100X72X124 (DRAPES) ×2 IMPLANT
DRAPE MICROSCOPE LEICA (MISCELLANEOUS) ×1 IMPLANT
DRAPE POUCH INSTRU U-SHP 10X18 (DRAPES) ×2 IMPLANT
DRAPE SURG 17X23 STRL (DRAPES) ×2 IMPLANT
DRSG OPSITE POSTOP 4X6 (GAUZE/BANDAGES/DRESSINGS) ×1 IMPLANT
DURAPREP 26ML APPLICATOR (WOUND CARE) ×2 IMPLANT
ELECT CAUTERY BLADE 6.4 (BLADE) ×1 IMPLANT
ELECT REM PT RETURN 9FT ADLT (ELECTROSURGICAL) ×2
ELECTRODE REM PT RTRN 9FT ADLT (ELECTROSURGICAL) ×1 IMPLANT
GAUZE SPONGE 4X4 16PLY XRAY LF (GAUZE/BANDAGES/DRESSINGS) IMPLANT
GLOVE BIO SURGEON STRL SZ7 (GLOVE) ×1 IMPLANT
GLOVE BIO SURGEON STRL SZ8 (GLOVE) ×2 IMPLANT
GLOVE BIOGEL PI IND STRL 7.0 (GLOVE) IMPLANT
GLOVE BIOGEL PI IND STRL 8 (GLOVE) IMPLANT
GLOVE BIOGEL PI IND STRL 8.5 (GLOVE) IMPLANT
GLOVE BIOGEL PI INDICATOR 7.0 (GLOVE) ×1
GLOVE BIOGEL PI INDICATOR 8 (GLOVE) ×4
GLOVE BIOGEL PI INDICATOR 8.5 (GLOVE) ×1
GLOVE ECLIPSE 7.5 STRL STRAW (GLOVE) ×3 IMPLANT
GLOVE ECLIPSE 8.5 STRL (GLOVE) ×1 IMPLANT
GOWN STRL REUS W/ TWL LRG LVL3 (GOWN DISPOSABLE) IMPLANT
GOWN STRL REUS W/ TWL XL LVL3 (GOWN DISPOSABLE) ×1 IMPLANT
GOWN STRL REUS W/TWL 2XL LVL3 (GOWN DISPOSABLE) ×3 IMPLANT
GOWN STRL REUS W/TWL LRG LVL3 (GOWN DISPOSABLE) ×2
GOWN STRL REUS W/TWL XL LVL3 (GOWN DISPOSABLE) ×2
HEMOSTAT POWDER KIT SURGIFOAM (HEMOSTASIS) ×1 IMPLANT
KIT BASIN OR (CUSTOM PROCEDURE TRAY) ×2 IMPLANT
KIT ROOM TURNOVER OR (KITS) ×2 IMPLANT
NDL HYPO 25X1 1.5 SAFETY (NEEDLE) ×1 IMPLANT
NDL SPNL 20GX3.5 QUINCKE YW (NEEDLE) IMPLANT
NEEDLE HYPO 25X1 1.5 SAFETY (NEEDLE) ×2 IMPLANT
NEEDLE SPNL 20GX3.5 QUINCKE YW (NEEDLE) IMPLANT
NS IRRIG 1000ML POUR BTL (IV SOLUTION) ×2 IMPLANT
OIL CARTRIDGE MAESTRO DRILL (MISCELLANEOUS) ×2
PACK LAMINECTOMY NEURO (CUSTOM PROCEDURE TRAY) ×2 IMPLANT
PAD ARMBOARD 7.5X6 YLW CONV (MISCELLANEOUS) ×6 IMPLANT
RUBBERBAND STERILE (MISCELLANEOUS) ×2 IMPLANT
SEALANT ADHERUS EXTEND TIP (MISCELLANEOUS) ×1 IMPLANT
SPONGE SURGIFOAM ABS GEL SZ50 (HEMOSTASIS) ×2 IMPLANT
STRIP CLOSURE SKIN 1/2X4 (GAUZE/BANDAGES/DRESSINGS) ×2 IMPLANT
SUT VIC AB 0 CT1 18XCR BRD8 (SUTURE) ×1 IMPLANT
SUT VIC AB 0 CT1 8-18 (SUTURE) ×2
SUT VIC AB 2-0 CP2 18 (SUTURE) ×2 IMPLANT
SUT VIC AB 3-0 SH 8-18 (SUTURE) ×2 IMPLANT
TOWEL GREEN STERILE (TOWEL DISPOSABLE) ×2 IMPLANT
TOWEL GREEN STERILE FF (TOWEL DISPOSABLE) ×2 IMPLANT
WATER STERILE IRR 1000ML POUR (IV SOLUTION) ×2 IMPLANT

## 2016-11-02 NOTE — H&P (Signed)
Subjective: Patient is a 83 y.o. male admitted for R leg pain. Onset of symptoms was several months ago, gradually worsening since that time.  The pain is rated severe, and is located at the across the lower back and radiates to RLE. The pain is described as aching and occurs all day. The symptoms have been progressive. Symptoms are exacerbated by exercise. MRI or CT showed stenosis   Past Medical History:  Diagnosis Date  . Anxiety   . Aortic aneurysm (HCC)   . Arthritis   . COPD (chronic obstructive pulmonary disease) (HCC)   . Coronary artery disease   . Dementia   . Depression   . Diabetes mellitus without complication (HCC)   . GERD (gastroesophageal reflux disease)   . Headache   . History of kidney stones   . Hypertension   . Neuromuscular disorder (HCC)    diabetic neuropathy    Past Surgical History:  Procedure Laterality Date  . ADENOIDECTOMY    . BACK SURGERY    . CARDIAC CATHETERIZATION    . CATARACT EXTRACTION W/ INTRAOCULAR LENS IMPLANT Bilateral   . CHOLECYSTECTOMY    . COLONOSCOPY    . FOOT FRACTURE SURGERY Right   . TONSILLECTOMY    . VASCULAR SURGERY Right    "Vein striped"    Prior to Admission medications   Medication Sig Start Date End Date Taking? Authorizing Provider  amLODipine (NORVASC) 5 MG tablet Take 5 mg by mouth daily.    [provider]  aspirin EC 81 MG tablet Take 81 mg by mouth daily.    [provider]  atorvastatin (LIPITOR) 80 MG tablet Take 80 mg by mouth at bedtime.    [provider]  Cholecalciferol (VITAMIN D3) 2000 units capsule Take 2,000 Units by mouth daily.    [provider]  docusate sodium (COLACE) 100 MG capsule Take 100 mg by mouth daily.    [provider]  donepezil (ARICEPT) 5 MG tablet Take 5 mg by mouth at bedtime.    [provider]  DULoxetine (CYMBALTA) 60 MG capsule Take 60 mg by mouth daily.    [provider]  finasteride (PROSCAR) 5 MG tablet Take  5 mg by mouth at bedtime.    [provider]  gabapentin (NEURONTIN) 300 MG capsule Take 300 mg by mouth 3 (three) times daily.    [provider]  HYDROcodone-acetaminophen (NORCO/VICODIN) 5-325 MG tablet Take 1 tablet by mouth 3 (three) times daily.    [provider]  insulin aspart (NOVOLOG) 100 UNIT/ML injection Inject 14 Units into the skin 3 (three) times daily before meals.     [provider]  insulin glargine (LANTUS) 100 UNIT/ML injection Inject 50 Units into the skin at bedtime.     [provider]  lisinopril (PRINIVIL,ZESTRIL) 40 MG tablet Take 40 mg by mouth daily.    [provider]  omeprazole (PRILOSEC) 20 MG capsule Take 40 mg by mouth daily.    [provider]  pantoprazole (PROTONIX) 40 MG tablet Take 40 mg by mouth daily.    [provider]  pregabalin (LYRICA) 150 MG capsule Take 150 mg by mouth 2 (two) times daily.    [provider]  sildenafil (REVATIO) 20 MG tablet Take 40-60 mg by mouth as needed for erectile dysfunction. 09/28/16   [provider]  traZODone (DESYREL) 150 MG tablet Take 150 mg by mouth at bedtime.    [provider]   No Known  Allergies  Social History  Substance Use Topics  . Smoking status: Current Every Day Smoker    Years: 72.00    Types: Cigars  . Smokeless tobacco: Never Used  . Alcohol use No    Family History  Problem Relation Age of Onset  . Parkinson's disease Mother   . Ulcers Father   . Cancer - Other Father      Review of Systems  Positive ROS: neg  All other systems have been reviewed and were otherwise negative with the exception of those mentioned in the HPI and as above.  Objective: Vital signs in last 24 hours: Temp:  [97.6 F (36.4 C)] 97.6 F (36.4 C) (08/10 1116) Resp:  [18] 18 (08/10 1116) BP: (156)/(57) 156/57 (08/10 1116) SpO2:  [99 %] 99 % (08/10 1116) Weight:  [85.6 kg (188 lb 11.4 oz)] 85.6 kg (188 lb 11.4  oz) (08/09 1234)  General Appearance: Alert, cooperative, no distress, appears stated age Head: Normocephalic, without obvious abnormality, atraumatic Eyes: PERRL, conjunctiva/corneas clear, EOM's intact    Neck: Supple, symmetrical, trachea midline Back: Symmetric, no curvature, ROM normal, no CVA tenderness Lungs:  respirations unlabored Heart: Regular rate and rhythm Abdomen: Soft, non-tender Extremities: Extremities normal, atraumatic, no cyanosis or edema Pulses: 2+ and symmetric all extremities Skin: Skin color, texture, turgor normal, no rashes or lesions  NEUROLOGIC:   Mental status: Alert and oriented x4,  no aphasia, good attention span, fund of knowledge, and memory Motor Exam - grossly normal Sensory Exam - grossly normal Reflexes: trace Coordination - grossly normal Gait - grossly normal Balance - grossly normal Cranial Nerves: I: smell Not tested  II: visual acuity  OS: nl    OD: nl  II: visual fields Full to confrontation  II: pupils Equal, round, reactive to light  III,VII: ptosis None  III,IV,VI: extraocular muscles  Full ROM  V: mastication Normal  V: facial light touch sensation  Normal  V,VII: corneal reflex  Present  VII: facial muscle function - upper  Normal  VII: facial muscle function - lower Normal  VIII: hearing Not tested  IX: soft palate elevation  Normal  IX,X: gag reflex Present  XI: trapezius strength  5/5  XI: sternocleidomastoid strength 5/5  XI: neck flexion strength  5/5  XII: tongue strength  Normal    Data Review Lab Results  Component Value Date   WBC 9.2 10/09/2016   HGB 12.6 (L) 10/09/2016   HCT 38.0 (L) 10/09/2016   MCV 82.8 10/09/2016   PLT 225 10/09/2016   Lab Results  Component Value Date   NA 138 10/09/2016   K 4.5 10/09/2016   CL 107 10/09/2016   CO2 23 10/09/2016   BUN 17 10/09/2016   CREATININE 1.13 10/09/2016   GLUCOSE 141 (H) 10/09/2016   Lab Results  Component Value Date   INR 1.02 10/09/2016     Assessment/Plan: Patient admitted for R L5-S1 laminectomy. Patient has failed a reasonable attempt at conservative therapy.  I explained the condition and procedure to the patient and answered any questions.  Patient wishes to proceed with procedure as planned. Understands risks/ benefits and typical outcomes of procedure.   Chad Cameron S 11/02/2016 11:19 AM

## 2016-11-02 NOTE — Anesthesia Postprocedure Evaluation (Signed)
Anesthesia Post Note  Patient: Lois Huxleyoy Wurster  Procedure(s) Performed: Procedure(s) (LRB): Laminectomy and Foraminotomy - Lumbar five-Sacral one - right (Right)     Patient location during evaluation: PACU Anesthesia Type: General Level of consciousness: awake and alert, patient cooperative and oriented Pain management: pain level controlled Vital Signs Assessment: post-procedure vital signs reviewed and stable Respiratory status: spontaneous breathing, nonlabored ventilation, respiratory function stable and patient connected to nasal cannula oxygen Cardiovascular status: blood pressure returned to baseline and stable Postop Assessment: no signs of nausea or vomiting Anesthetic complications: no    Last Vitals:  Vitals:   11/02/16 1116 11/02/16 1604  BP: (!) 156/57 (!) 158/81  Pulse:  85  Resp: 18   Temp: 36.4 C 36.7 C  SpO2: 99% 90%    Last Pain:  Vitals:   11/02/16 1155  TempSrc:   PainSc: 6                  Meaghan Whistler,E. Rawley Harju

## 2016-11-02 NOTE — Op Note (Signed)
11/02/2016  3:56 PM  PATIENT:  Chad Cameron  83 y.o. male  PRE-OPERATIVE DIAGNOSIS:  Right L5-S1 spinal stenosis, right leg pain  POST-OPERATIVE DIAGNOSIS:  same  PROCEDURE:  Right L5-S1 hemilaminectomy, medial facetectomy, and foraminotomy for decompression of the right S1 nerve root  SURGEON:  Marikay Alaravid Cedrick Partain, MD  ASSISTANTSDanielle Dess: Elsner  ANESTHESIA:   General  EBL: 50 ml  Total I/O In: 1000 [I.V.:1000] Out: 50 [Blood:50]  BLOOD ADMINISTERED: none  DRAINS: none  SPECIMEN:  none  INDICATION FOR PROCEDURE: This patient presented with severe right leg pain. Imaging showed right L5-S1 lateral recess stenosis. The patient tried conservative measures without relief. Pain was debilitating. Recommended right L5-S1 hemilaminectomy. Patient understood the risks, benefits, and alternatives and potential outcomes and wished to proceed.  PROCEDURE DETAILS: The patient was taken to the operating room and after induction of adequate generalized endotracheal anesthesia, the patient was rolled into the prone position on the Wilson frame and all pressure points were padded. The lumbar region was cleaned and then prepped with DuraPrep and draped in the usual sterile fashion. 5 cc of local anesthesia was injected and then a dorsal midline incision was made and carried down to the lumbo sacral fascia. The fascia was opened and the paraspinous musculature was taken down in a subperiosteal fashion to expose L5-S1 on the right. Intraoperative x-ray confirmed my level, and then I used a combination of the high-speed drill and the Kerrison punches to perform a hemilaminectomy, medial facetectomy, and foraminotomy at L5-S1 on the right. The underlying yellow ligament was opened and removed in a piecemeal fashion to expose the underlying dura and exiting nerve root. He had significant overgrowth of the yellow ligament and of the medial facet. I undercut the lateral recess and dissected down until I was medial to and distal  to the pedicle. The nerve root was well decompressed. At one point I thought I saw a little bit of arachnoid over the S1 nerve root but no obvious CSF leakage. Therefore I used 20 fibrin glue over a piece of fat at the end of the case.. I then palpated with a coronary dilator along the nerve root and into the foramen to assure adequate decompression. I felt no more compression of the nerve root. I irrigated with saline solution containing bacitracin. Achieved hemostasis with bipolar cautery, lined the dura with Gelfoam, and then closed the fascia with 0 Vicryl. I closed the subcutaneous tissues with 2-0 Vicryl and the subcuticular tissues with 3-0 Vicryl. The skin was then closed with benzoin and Steri-Strips. The drapes were removed, a sterile dressing was applied. The patient was awakened from general anesthesia and transferred to the recovery room in stable condition. At the end of the procedure all sponge, needle and instrument counts were correct.    PLAN OF CARE: Admit for overnight observation  PATIENT DISPOSITION:  PACU - hemodynamically stable.   Delay start of Pharmacological VTE agent (>24hrs) due to surgical blood loss or risk of bleeding:  yes

## 2016-11-02 NOTE — Anesthesia Preprocedure Evaluation (Addendum)
Anesthesia Evaluation  Patient identified by MRN, date of birth, ID band Patient awake    Reviewed: Allergy & Precautions, NPO status , Patient's Chart, lab work & pertinent test results  History of Anesthesia Complications Negative for: history of anesthetic complications  Airway Mallampati: II  TM Distance: >3 FB Neck ROM: Full    Dental  (+) Edentulous Upper, Edentulous Lower   Pulmonary COPD, Current Smoker,    breath sounds clear to auscultation       Cardiovascular hypertension, Pt. on medications (-) angina+ CAD ('12 cath: moderate disease, EF 55% (medical management)) and + Peripheral Vascular Disease (AAA)   Rhythm:Regular Rate:Normal  '15 ECHO: EF 50-55%, mild AI, 4.7 cm aortic root dilation   Neuro/Psych Anxiety Depression Chronic back pain: narcotics    GI/Hepatic Neg liver ROS, GERD  Medicated and Controlled,  Endo/Other  diabetes (glu 100), Insulin Dependent  Renal/GU negative Renal ROS     Musculoskeletal  (+) Arthritis ,   Abdominal   Peds  Hematology   Anesthesia Other Findings   Reproductive/Obstetrics                            Anesthesia Physical Anesthesia Plan  ASA: III  Anesthesia Plan: General   Post-op Pain Management:    Induction: Intravenous  PONV Risk Score and Plan: 2 and Ondansetron and Dexamethasone  Airway Management Planned:   Additional Equipment:   Intra-op Plan:   Post-operative Plan: Extubation in OR  Informed Consent: I have reviewed the patients History and Physical, chart, labs and discussed the procedure including the risks, benefits and alternatives for the proposed anesthesia with the patient or authorized representative who has indicated his/her understanding and acceptance.     Plan Discussed with: Surgeon and CRNA  Anesthesia Plan Comments: (Plan routine monitors, GETA)        Anesthesia Quick Evaluation

## 2016-11-02 NOTE — Anesthesia Procedure Notes (Signed)
Procedure Name: Intubation Date/Time: 11/02/2016 2:52 PM Performed by: Myna Bright Pre-anesthesia Checklist: Patient identified, Emergency Drugs available, Patient being monitored and Suction available Patient Re-evaluated:Patient Re-evaluated prior to induction Oxygen Delivery Method: Circle system utilized Preoxygenation: Pre-oxygenation with 100% oxygen Induction Type: IV induction Ventilation: Mask ventilation without difficulty and Oral airway inserted - appropriate to patient size Laryngoscope Size: Mac and 3 Grade View: Grade II Tube type: Oral Tube size: 7.5 mm Number of attempts: 1 Airway Equipment and Method: Stylet Placement Confirmation: ETT inserted through vocal cords under direct vision,  positive ETCO2 and breath sounds checked- equal and bilateral Secured at: 21 cm Tube secured with: Tape Dental Injury: Teeth and Oropharynx as per pre-operative assessment

## 2016-11-02 NOTE — Transfer of Care (Signed)
Immediate Anesthesia Transfer of Care Note  Patient: Chad Cameron  Procedure(s) Performed: Procedure(s): Laminectomy and Foraminotomy - Lumbar five-Sacral one - right (Right)  Patient Location: PACU  Anesthesia Type:General  Level of Consciousness: awake, alert , oriented and patient cooperative  Airway & Oxygen Therapy: Patient Spontanous Breathing and Patient connected to nasal cannula oxygen  Post-op Assessment: Report given to RN, Post -op Vital signs reviewed and stable and Patient moving all extremities  Post vital signs: Reviewed and stable  Last Vitals:  Vitals:   11/02/16 1116 11/02/16 1604  BP: (!) 156/57 (!) 158/81  Pulse:  85  Resp: 18   Temp: 36.4 C 36.7 C  SpO2: 99% 90%    Last Pain:  Vitals:   11/02/16 1155  TempSrc:   PainSc: 6       Patients Stated Pain Goal: 7 (11/02/16 1155)  Complications: No apparent anesthesia complications

## 2016-11-03 ENCOUNTER — Encounter (HOSPITAL_COMMUNITY): Payer: Self-pay | Admitting: Neurological Surgery

## 2016-11-03 DIAGNOSIS — M4807 Spinal stenosis, lumbosacral region: Secondary | ICD-10-CM | POA: Diagnosis not present

## 2016-11-03 LAB — GLUCOSE, CAPILLARY: Glucose-Capillary: 179 mg/dL — ABNORMAL HIGH (ref 65–99)

## 2016-11-03 MED ORDER — HYDROCODONE-ACETAMINOPHEN 7.5-325 MG PO TABS: 1.0000 | ORAL_TABLET | Freq: Four times a day (QID) | ORAL | 0 refills | Status: DC

## 2016-11-03 MED ORDER — METHOCARBAMOL 500 MG PO TABS: 500.0000 mg | ORAL_TABLET | Freq: Four times a day (QID) | ORAL | 3 refills | Status: DC | PRN

## 2016-11-03 NOTE — Discharge Summary (Signed)
Physician Discharge Summary  Patient ID: Chad Cameron MRN: [id number] DOB/AGE: 12-10-1931 83 y.o.  Admit date: 11/02/2016 Discharge date: 11/03/2016  Admission Diagnoses:Spondylosis L5-S1 with lumbar radiculopathy  Discharge Diagnoses: Spondylosis L5-S1 with lumbar radiculopathy Active Problems:   S/P lumbar laminectomy   Discharged Condition: good  Hospital Course: Patient was admitted to undergo surgery which he tolerated well  Consults: None  Significant Diagnostic Studies: None  Treatments: surgery: Laminotomy and foraminotomy L5-S1  Discharge Exam: Blood pressure (!) 144/80, pulse 77, temperature (!) 97.5 F (36.4 C), resp. rate 20, height 6' (1.829 m), weight 85.3 kg (188 lb), SpO2 95 %. Incision is clean and dry. Station and gait are intact.  Disposition:  discharge home  Discharge Instructions    Call MD for:  redness, tenderness, or signs of infection (pain, swelling, redness, odor or green/yellow discharge around incision site)    Complete by:  As directed    Call MD for:  severe uncontrolled pain    Complete by:  As directed    Call MD for:  temperature >100.4    Complete by:  As directed    Diet - low sodium heart healthy    Complete by:  As directed    Increase activity slowly    Complete by:  As directed      Allergies as of 11/03/2016   No Known Allergies     Medication List    TAKE these medications   amLODipine 5 MG tablet Commonly known as:  NORVASC Take 5 mg by mouth daily.   aspirin EC 81 MG tablet Take 81 mg by mouth daily.   atorvastatin 80 MG tablet Commonly known as:  LIPITOR Take 80 mg by mouth at bedtime.   clopidogrel 75 MG tablet Commonly known as:  PLAVIX Take 75 mg by mouth daily.   docusate sodium 100 MG capsule Commonly known as:  COLACE Take 100 mg by mouth daily.   donepezil 5 MG tablet Commonly known as:  ARICEPT Take 5 mg by mouth at bedtime.   DULoxetine 60 MG capsule Commonly known as:  CYMBALTA Take 60 mg by  mouth daily.   finasteride 5 MG tablet Commonly known as:  PROSCAR Take 5 mg by mouth at bedtime.   gabapentin 300 MG capsule Commonly known as:  NEURONTIN Take 300 mg by mouth 3 (three) times daily.   HYDROcodone-acetaminophen 7.5-325 MG tablet Commonly known as:  NORCO Take 1 tablet by mouth every 6 (six) hours.   insulin aspart 100 UNIT/ML injection Commonly known as:  novoLOG Inject 14 Units into the skin 3 (three) times daily before meals.   insulin glargine 100 UNIT/ML injection Commonly known as:  LANTUS Inject 50 Units into the skin at bedtime.   lisinopril 40 MG tablet Commonly known as:  PRINIVIL,ZESTRIL Take 40 mg by mouth daily.   methocarbamol 500 MG tablet Commonly known as:  ROBAXIN Take 1 tablet (500 mg total) by mouth every 6 (six) hours as needed for muscle spasms.   omeprazole 20 MG capsule Commonly known as:  PRILOSEC Take 40 mg by mouth daily.   pantoprazole 40 MG tablet Commonly known as:  PROTONIX Take 40 mg by mouth daily.   pregabalin 150 MG capsule Commonly known as:  LYRICA Take 150 mg by mouth 2 (two) times daily.   sildenafil 20 MG tablet Commonly known as:  REVATIO Take 40-60 mg by mouth as needed for erectile dysfunction.   traZODone 150 MG tablet Commonly known as:  DESYREL Take  150 mg by mouth at bedtime.   Vitamin D3 2000 units capsule Take 2,000 Units by mouth daily.        SignedStefani Dama: Lynsie Mcwatters J 11/03/2016, 7:21 AM

## 2016-11-03 NOTE — Progress Notes (Signed)
Patient is discharged from room 3C11 at this time. Alert and in stable condition. IV site d/c'd and instructions read to patient with understanding verbalized. Left unit via wheelchair with all belongings at side. 

## 2016-11-03 NOTE — Care Management Obs Status (Signed)
MEDICARE OBSERVATION STATUS NOTIFICATION   Patient Details  Name: Chad Cameron MRN: [id number] Date of Birth: 1932-03-26   Medicare Observation Status Notification Given:  Yes    Durenda GuthrieBrady, [name] Naomi, RN 11/03/2016, 9:31 AM

## 2016-12-19 ENCOUNTER — Ambulatory Visit: Payer: Medicare Other | Admitting: Cardiology

## 2017-01-24 DIAGNOSIS — R0989 Other specified symptoms and signs involving the circulatory and respiratory systems: Secondary | ICD-10-CM | POA: Insufficient documentation

## 2017-07-05 DIAGNOSIS — F17200 Nicotine dependence, unspecified, uncomplicated: Secondary | ICD-10-CM | POA: Insufficient documentation

## 2017-07-19 DIAGNOSIS — F411 Generalized anxiety disorder: Secondary | ICD-10-CM | POA: Insufficient documentation

## 2017-07-19 DIAGNOSIS — F331 Major depressive disorder, recurrent, moderate: Secondary | ICD-10-CM | POA: Insufficient documentation

## 2017-09-06 ENCOUNTER — Other Ambulatory Visit: Payer: Self-pay | Admitting: Student

## 2017-09-06 DIAGNOSIS — R269 Unspecified abnormalities of gait and mobility: Secondary | ICD-10-CM

## 2017-09-19 ENCOUNTER — Ambulatory Visit
Admission: RE | Admit: 2017-09-19 | Discharge: 2017-09-19 | Disposition: A | Discharge status: Home / Self Care | Payer: Medicare Other | Source: Ambulatory Visit | Attending: Student | Admitting: Student

## 2017-09-19 DIAGNOSIS — R269 Unspecified abnormalities of gait and mobility: Secondary | ICD-10-CM

## 2017-12-05 DIAGNOSIS — G629 Polyneuropathy, unspecified: Secondary | ICD-10-CM | POA: Insufficient documentation

## 2017-12-11 DIAGNOSIS — S22000A Wedge compression fracture of unspecified thoracic vertebra, initial encounter for closed fracture: Secondary | ICD-10-CM | POA: Insufficient documentation

## 2018-01-01 ENCOUNTER — Encounter (INDEPENDENT_AMBULATORY_CARE_PROVIDER_SITE_OTHER): Payer: Self-pay | Admitting: Orthopaedic Surgery

## 2018-01-01 ENCOUNTER — Ambulatory Visit (INDEPENDENT_AMBULATORY_CARE_PROVIDER_SITE_OTHER): Payer: Medicare Other | Admitting: Orthopaedic Surgery

## 2018-01-01 ENCOUNTER — Ambulatory Visit (INDEPENDENT_AMBULATORY_CARE_PROVIDER_SITE_OTHER): Payer: Self-pay

## 2018-01-01 DIAGNOSIS — M25551 Pain in right hip: Secondary | ICD-10-CM

## 2018-01-01 DIAGNOSIS — M1611 Unilateral primary osteoarthritis, right hip: Secondary | ICD-10-CM | POA: Diagnosis not present

## 2018-01-01 MED ORDER — TRAMADOL HCL 50 MG PO TABS: 50.0000 mg | ORAL_TABLET | Freq: Four times a day (QID) | ORAL | 0 refills | Status: DC | PRN

## 2018-01-01 NOTE — Progress Notes (Signed)
Office Visit Note   Patient: Chad Cameron           Date of Birth: 09-25-1931           MRN: [id number] Visit Date: 01/01/2018              Requested by: Tia Alert, MD [address] [place], [place] [address] PCP: System, Pcp Not In   Assessment & Plan: Visit Diagnoses:  1. Right hip pain   2. Unilateral primary osteoarthritis, right hip     Plan: Given the severity of his right hip pain combined with his clinical exam findings and x-ray findings we are recommending a total hip arthroplasty.  We had a long thorough discussion about the risk and benefits of the surgery.  We talked about his intraoperative and postoperative course and what these things involved.  He does have someone at home who can be with him and he prefers to go home after surgery and a short hospital stay with home health therapy.  I will send in some tramadol for pain.  He is requesting something stronger but I would like to defer any stronger pain medications until after surgery.  We will work on getting this scheduled in the near future.  All question concerns were answered and addressed.  Follow-Up Instructions: Return for 2 weeks post-op.   Orders:  Orders Placed This Encounter  Procedures  . XR HIP UNILAT W OR W/O PELVIS 1V RIGHT   Meds ordered this encounter  Medications  . traMADol (ULTRAM) 50 MG tablet    Sig: Take 1-2 tablets (50-100 mg total) by mouth every 6 (six) hours as needed.    Dispense:  40 tablet    Refill:  0      Procedures: No procedures performed   Clinical Data: No additional findings.   Subjective: Chief Complaint  Patient presents with  . Right Hip - Pain  Patient is a very pleasant 84 year old gentleman who comes in for further relation treatment of severe pain with his right hip.  He had a mechanical fall over a year ago landing in his bathtub and since then he is not been able to walk well.  He did end up having significant lumbar spine issues and  actually underwent a laminectomy and foraminotomy at L5-S1 to the right side by Dr. Marikay Alar 14 months ago.  He still has a significant amount of back pain but is more in his groin and around his hip and radiates down to his knee.  He is ablating using a cane.  His pain is been severe and it is detrimentally affecting his activities daily living, his quality of life and his mobility.  He lives at home with what he is describes a girlfriend.  He is a diabetic but reports good control.  He is on Plavix.  He has been on chronic pain medications in the past as well.  He is also on insulin.  HPI  Review of Systems He currently denies any headache, chest pain, shortness of breath, fever, chills, nausea, vomiting.  Objective: Vital Signs: There were no vitals taken for this visit.  Physical Exam He is alert and oriented x3 in no acute distress Ortho Exam Examination of his right hip shows essentially almost no internal and external rotation and severe pain in the groin.  It does radiate to his knee. Specialty Comments:  No specialty comments available.  Imaging: Xr Hip Unilat W Or W/o Pelvis 1v  Right  Result Date: 01/01/2018 An AP pelvis and lateral of the right hip show severe end-stage arthritis.  There is significant para-articular osteophytes and almost complete loss of joint space.  There looks like this may be a component of osteonecrosis as well.  In light of a mechanical fall with significant trauma there is likely a component of necrosis of the hip joint.    PMFS History: Patient Active Problem List   Diagnosis Date Noted  . Unilateral primary osteoarthritis, right hip 01/01/2018  . S/P lumbar laminectomy 11/02/2016   Past Medical History:  Diagnosis Date  . Anxiety   . Aortic aneurysm (HCC)   . Arthritis   . COPD (chronic obstructive pulmonary disease) (HCC)   . Coronary artery disease   . Dementia (HCC)   . Depression   . Diabetes mellitus without complication (HCC)   .  GERD (gastroesophageal reflux disease)   . Headache   . History of kidney stones   . Hypertension   . Neuromuscular disorder (HCC)    diabetic neuropathy    Family History  Problem Relation Age of Onset  . Parkinson's disease Mother   . Ulcers Father   . Cancer - Other Father     Past Surgical History:  Procedure Laterality Date  . ADENOIDECTOMY    . BACK SURGERY    . CARDIAC CATHETERIZATION    . CATARACT EXTRACTION W/ INTRAOCULAR LENS IMPLANT Bilateral   . CHOLECYSTECTOMY    . COLONOSCOPY    . FOOT FRACTURE SURGERY Right   . LUMBAR LAMINECTOMY/DECOMPRESSION MICRODISCECTOMY Right 11/02/2016   Procedure: Laminectomy and Foraminotomy - Lumbar five-Sacral one - right;  Surgeon: Tia Alert, MD;  Location: [facility] OR;  Service: Neurosurgery;  Laterality: Right;  . TONSILLECTOMY    . VASCULAR SURGERY Right    "Vein striped"   Social History   Occupational History  . Not on file  Tobacco Use  . Smoking status: Current Every Day Smoker    Years: 72.00    Types: Cigars  . Smokeless tobacco: Never Used  Substance and Sexual Activity  . Alcohol use: No  . Drug use: No  . Sexual activity: Not on file

## 2018-01-17 ENCOUNTER — Other Ambulatory Visit (INDEPENDENT_AMBULATORY_CARE_PROVIDER_SITE_OTHER): Payer: Self-pay | Admitting: Physician Assistant

## 2018-01-17 NOTE — Pre-Procedure Instructions (Signed)
Chad Cameron  01/17/2018      [facility] - [place], [place] - [address] [name] [address] Phone: [phone] Fax: [phone]  [facility]- [place], [place] - [address] [place] [address] [place] [address] Phone: [phone] Fax: [phone]    Your procedure is scheduled on Tues. Nov. 5, 2019 from 3:00PM-4:45PM  Report to [facility] Admitting Entrance "A" at 1:00PM  Call this number if you have problems the morning of surgery:  [phone]   Remember:  Do not eat or drink after midnight on Nov. 4th    Take these medicines the morning of surgery with A SIP OF WATER: AmLODipine (NORVASC), DULoxetine (CYMBALTA), Gabapentin (NEURONTIN), Omeprazole (PRILOSEC), Pantoprazole (PROTONIX), and Pregabalin (LYRICA)   If needed: HYDROcodone-acetaminophen (NORCO) and Methocarbamol (ROBAXIN)  Follow your surgeon's instructions on when to stop Aspirin and Plavix.  If no instructions were given by your surgeon then you will need to call the office to get those instructions.    7 days before surgery (01/21/18), stop taking all Other Aspirin Products, Vitamins, Fish oils, and Herbal medications. Also stop all NSAIDS i.e. Advil, Ibuprofen, Motrin, Aleve, Anaprox, Naproxen, BC, Goody Powders, and all Supplements.  Do not take oral diabetes medicines (pills) and Insulin aspart (NOVOLOG) the morning of surgery.  THE NIGHT BEFORE SURGERY, take ____25_______ units of ____Insulin glargine (LANTUS)_______insulin. (1/2 of regular dose)   How to Manage Your Diabetes Before and After Surgery  Why is it important to control my blood sugar before and after surgery? . Improving blood sugar levels before and after surgery helps healing and can limit problems. . A way of improving blood sugar control is eating a healthy diet by: o  Eating less sugar and carbohydrates o  Increasing  activity/exercise o  Talking with your doctor about reaching your blood sugar goals . High blood sugars (greater than 180 mg/dL) can raise your risk of infections and slow your recovery, so you will need to focus on controlling your diabetes during the weeks before surgery. . Make sure that the doctor who takes care of your diabetes knows about your planned surgery including the date and location.  How do I manage my blood sugar before surgery? . Check your blood sugar at least 4 times a day, starting 2 days before surgery, to make sure that the level is not too high or low. o Check your blood sugar the morning of your surgery when you wake up and every 2 hours until you get to the Short Stay unit. . If your blood sugar is less than 70 mg/dL, you will need to treat for low blood sugar: o Do not take insulin. o Treat a low blood sugar (less than 70 mg/dL) with  cup of clear juice (cranberry or apple), 4 glucose tablets, OR glucose gel. Recheck blood sugar in 15 minutes after treatment (to make sure it is greater than 70 mg/dL). If your blood sugar is not greater than 70 mg/dL on recheck, call [phone] o  for further instructions. .  If your CBG is greater than 220 mg/dL, you may take  of your sliding scale (correction) dose of insulin.  . If you are admitted to the hospital after surgery: o Your blood sugar will be checked by the staff and you will probably be given insulin after surgery (instead of oral diabetes medicines) to make sure you have good blood sugar levels. o The  goal for blood sugar control after surgery is 80-180 mg/dL.    Reviewed and Endorsed by [facility] Patient Education Committee, August 2015    Do not wear jewelry.  Do not wear lotions, powders, colognes, or deodorant.  Do not shave 48 hours prior to surgery.  Men may shave face.  Do not bring valuables to the hospital.  [facility] is not responsible for any belongings or valuables.  Contacts, dentures or  bridgework may not be worn into surgery.  Leave your suitcase in the car.  After surgery it may be brought to your room.  For patients admitted to the hospital, discharge time will be determined by your treatment team.  Patients discharged the day of surgery will not be allowed to drive home.   Special instructions:  [place]- Preparing For Surgery  Before surgery, you can play an important role. Because skin is not sterile, your skin needs to be as free of germs as possible. You can reduce the number of germs on your skin by washing with CHG (chlorahexidine gluconate) Soap before surgery.  CHG is an antiseptic cleaner which kills germs and bonds with the skin to continue killing germs even after washing.    Oral Hygiene is also important to reduce your risk of infection.  Remember - BRUSH YOUR TEETH THE MORNING OF SURGERY WITH YOUR REGULAR TOOTHPASTE  Please do not use if you have an allergy to CHG or antibacterial soaps. If your skin becomes reddened/irritated stop using the CHG.  Do not shave (including legs and underarms) for at least 48 hours prior to first CHG shower. It is OK to shave your face.  Please follow these instructions carefully.   1. Shower the NIGHT BEFORE SURGERY and the MORNING OF SURGERY with CHG.   2. If you chose to wash your hair, wash your hair first as usual with your normal shampoo.  3. After you shampoo, rinse your hair and body thoroughly to remove the shampoo.  4. Use CHG as you would any other liquid soap. You can apply CHG directly to the skin and wash gently with a scrungie or a clean washcloth.   5. Apply the CHG Soap to your body ONLY FROM THE NECK DOWN.  Do not use on open wounds or open sores. Avoid contact with your eyes, ears, mouth and genitals (private parts). Wash Face and genitals (private parts)  with your normal soap.  6. Wash thoroughly, paying special attention to the area where your surgery will be performed.  7. Thoroughly rinse your  body with warm water from the neck down.  8. DO NOT shower/wash with your normal soap after using and rinsing off the CHG Soap.  9. Pat yourself dry with a CLEAN TOWEL.  10. Wear CLEAN PAJAMAS to bed the night before surgery, wear comfortable clothes the morning of surgery  11. Place CLEAN SHEETS on your bed the night of your first shower and DO NOT SLEEP WITH PETS.  Day of Surgery:  Do not apply any deodorants/lotions.  Please wear clean clothes to the hospital/surgery center.   Remember to brush your teeth WITH YOUR REGULAR TOOTHPASTE.  Please read over the following fact sheets that you were given. Pain Booklet, Coughing and Deep Breathing, MRSA Information and Surgical Site Infection Prevention

## 2018-01-20 ENCOUNTER — Encounter (HOSPITAL_COMMUNITY)
Admission: RE | Admit: 2018-01-20 | Discharge: 2018-01-20 | Disposition: A | Discharge status: Home / Self Care | Payer: Medicare Other | Source: Ambulatory Visit | Attending: Orthopaedic Surgery | Admitting: Orthopaedic Surgery

## 2018-01-20 ENCOUNTER — Encounter (HOSPITAL_COMMUNITY): Payer: Self-pay

## 2018-01-20 ENCOUNTER — Other Ambulatory Visit: Payer: Self-pay

## 2018-01-20 DIAGNOSIS — E119 Type 2 diabetes mellitus without complications: Secondary | ICD-10-CM | POA: Diagnosis not present

## 2018-01-20 DIAGNOSIS — Z01812 Encounter for preprocedural laboratory examination: Secondary | ICD-10-CM | POA: Insufficient documentation

## 2018-01-20 HISTORY — DX: Dyspnea, unspecified: R06.00

## 2018-01-20 HISTORY — DX: Schizophrenia, unspecified: F20.9

## 2018-01-20 HISTORY — DX: Pneumonia, unspecified organism: J18.9

## 2018-01-20 LAB — BASIC METABOLIC PANEL
Anion gap: 6 (ref 5–15)
BUN: 12 mg/dL (ref 8–23)
CO2: 26 mmol/L (ref 22–32)
Calcium: 9.2 mg/dL (ref 8.9–10.3)
Chloride: 109 mmol/L (ref 98–111)
Creatinine, Ser: 0.95 mg/dL (ref 0.61–1.24)
GFR calc Af Amer: >60 mL/min (ref >60)
GFR calc non Af Amer: >60 mL/min (ref >60)
Glucose, Bld: 96 mg/dL (ref 70–99)
Potassium: 4.2 mmol/L (ref 3.5–5.1)
Sodium: 141 mmol/L (ref 135–145)

## 2018-01-20 LAB — CBC
HCT: 40.4 % (ref 39.0–52.0)
Hemoglobin: 12.3 g/dL — ABNORMAL LOW (ref 13.0–17.0)
MCH: 26.9 pg (ref 26.0–34.0)
MCHC: 30.4 g/dL (ref 30.0–36.0)
MCV: 88.4 fL (ref 80.0–100.0)
Platelets: 274 10*3/uL (ref 150–400)
RBC: 4.57 MIL/uL (ref 4.22–5.81)
RDW: 13.1 % (ref 11.5–15.5)
WBC: 7.3 10*3/uL (ref 4.0–10.5)
nRBC: 0 % (ref 0.0–0.2)

## 2018-01-20 LAB — SURGICAL PCR SCREEN
MRSA, PCR: NEGATIVE
Staphylococcus aureus: NEGATIVE

## 2018-01-20 LAB — GLUCOSE, CAPILLARY: Glucose-Capillary: 88 mg/dL (ref 70–99)

## 2018-01-20 LAB — HEMOGLOBIN A1C
Hgb A1c MFr Bld: 6.2 % — ABNORMAL HIGH (ref 4.8–5.6)
Mean Plasma Glucose: 131.24 mg/dL

## 2018-01-20 NOTE — Progress Notes (Addendum)
Mr Mapps denies chest pain or shortness of breath. Mr Casto reports thAt his memory is really baD.Patient said that he is seen at [facility] in [place] and has a PCP, Dr Lyman Bishop, [facility]. Mr. Cerveny denies hacving a cardiologist. Patient is not sure who ordered Plavix. Patient reports that he stopped Plavix and Asprin soon after appointment with Dr Magnus Ivan- patient was seen 01/01/18.  Patient has a nurse from [facility] that prepares medications for patient.  Patient called his nurse during the PAt appointment so she could speak with Pharmacy Tech, I asked her when did she stop the Plavix, she could not tell me, I see him every 2 weeks, I am not sure how close to 01/01/18 that I saw the patient. I called Caren Griffins, patient home health nurse and asked her to check her schedule and call me back with the date that Plavix was removed from patient's medications and what other medications were removed. I found in Rica Mast, NP-C notes that patient's cardiologist is Dr Donnie Aho. I called patient's home and asked him to check prescription bottle and tell me who ordered Plavix, patient had his girlfriend read the bottle (doent read well), I was informed that Plavix prescription is from a [place] MD.

## 2018-01-20 NOTE — Pre-Procedure Instructions (Signed)
Chad Cameron  01/20/2018      Your procedure is scheduled on Tues. Nov. 5, 2019   Report to [facility] Admitting Entrance "A" at 1:00PM                Your surgery or procedure is scheduled for 3:00    Call this number if you have problems the morning of surgery: [phone]  This is the number for the Pre- Surgical Desk.    Remember:  Do not eat or drink after midnight on Nov. 4th    Take these medicines the morning of surgery with A SIP OF WATER: AmLODipine (NORVASC) DULoxetine (CYMBALTA) Gabapentin (NEURONTIN) Omeprazole (PRILOSEC) Pantoprazole (PROTONIX) Pregabalin (LYRICA)   If needed: ProAir  Inhaler- bring it with you.  HYDROcodone-acetaminophen (NORCO) and Methocarbamol (ROBAXIN)  Follow your surgeon's instructions on when to stop Aspirin and Plavix.  If no instructions were given by your surgeon then you will need to call the office to get those instructions.    7 days before surgery (01/21/18), stop taking all Other Aspirin Products, Vitamins, Fish oils, and Herbal medications. Also stop all NSAIDS i.e. Advil, Ibuprofen, Motrin, Aleve, Anaprox, Naproxen, BC, Goody Powders, and all Supplements.  Do not take oral diabetes medicines (pills) and Insulin aspart (NOVOLOG) the morning of surgery.  THE NIGHT BEFORE SURGERY, take ____25_______ units of ____Insulin glargine (LANTUS)_______insulin. (1/2 of regular dose)   How to Manage Your Diabetes Before and After Surgery  Why is it important to control my blood sugar before and after surgery? . Improving blood sugar levels before and after surgery helps healing and can limit problems. . A way of improving blood sugar control is eating a healthy diet by: o  Eating less sugar and carbohydrates o  Increasing activity/exercise o  Talking with your doctor about reaching your blood sugar goals . High blood sugars (greater than 180 mg/dL) can raise your risk of infections and slow your recovery, so you will need  to focus on controlling your diabetes during the weeks before surgery. . Make sure that the doctor who takes care of your diabetes knows about your planned surgery including the date and location.  How do I manage my blood sugar before surgery? . Check your blood sugar at least 4 times a day, starting 2 days before surgery, to make sure that the level is not too high or low. o Check your blood sugar the morning of your surgery when you wake up and every 2 hours until you get to the Short Stay unit. . If your blood sugar is less than 70 mg/dL, you will need to treat for low blood sugar: o Do not take insulin. o Treat a low blood sugar (less than 70 mg/dL) with  cup of clear juice (cranberry or apple), 4 glucose tablets, OR glucose gel. Recheck blood sugar in 15 minutes after treatment (to make sure it is greater than 70 mg/dL). If your blood sugar is not greater than 70 mg/dL on recheck, call [phone] o  for further instructions. .  If your CBG is greater than 220 mg/dL, you may take  of your sliding scale (correction) dose of insulin.  . If you are admitted to the hospital after surgery: o Your blood sugar will be checked by the staff and you will probably be given insulin after surgery (instead of oral diabetes medicines) to make sure you have good blood sugar levels. o The goal for blood sugar control after surgery is  80-180 mg/dL.    Reviewed and Endorsed by [id number] Patient Education Committee, August 2015    Do not wear jewelry.  Do not wear lotions, powders, colognes, or deodorant.  Do not shave 48 hours prior to surgery.  Men may shave face.  Do not bring valuables to the hospital.  [facility] is not responsible for any belongings or valuables.  Contacts, dentures or bridgework may not be worn into surgery.  Leave your suitcase in the car.  After surgery it may be brought to your room.  For patients admitted to the hospital, discharge time will be determined by your  treatment team.  Patients discharged the day of surgery will not be allowed to drive home.   Special instructions:  [place]- Preparing For Surgery  Before surgery, you can play an important role. Because skin is not sterile, your skin needs to be as free of germs as possible. You can reduce the number of germs on your skin by washing with CHG (chlorahexidine gluconate) Soap before surgery.  CHG is an antiseptic cleaner which kills germs and bonds with the skin to continue killing germs even after washing.    Oral Hygiene is also important to reduce your risk of infection.  Remember - BRUSH YOUR TEETH THE MORNING OF SURGERY WITH YOUR REGULAR TOOTHPASTE  Please do not use if you have an allergy to CHG or antibacterial soaps. If your skin becomes reddened/irritated stop using the CHG.  Do not shave (including legs and underarms) for at least 48 hours prior to first CHG shower. It is OK to shave your face.  Please follow these instructions carefully.   1. Shower the NIGHT BEFORE SURGERY and the MORNING OF SURGERY with CHG.   2. If you chose to wash your hair, wash your hair first as usual with your normal shampoo.  3. After you shampoo, wash your face and private area with the soap you use at home, then rinse your hair and body thoroughly to remove the shampoo.  4. Use CHG as you would any other liquid soap. You can apply CHG directly to the skin and wash gently with a scrungie or a clean washcloth.   5. Apply the CHG Soap to your body ONLY FROM THE NECK DOWN.  Do not use on open wounds or open sores. Avoid contact with your eyes, ears, mouth and genitals (private parts).   6. Wash thoroughly, paying special attention to the area where your surgery will be performed.  7. Thoroughly rinse your body with warm water from the neck down.  8. DO NOT shower/wash with your normal soap after using and rinsing off the CHG Soap.  9. Pat yourself dry with a CLEAN TOWEL.  10. Wear CLEAN PAJAMAS  to bed the night before surgery, wear comfortable clothes the morning of surgery  11. Place CLEAN SHEETS on your bed the night of your first shower and DO NOT SLEEP WITH PETS.  Day of Surgery:  Shower as above  Do not apply any deodorants/lotions, powders or colognes.  Please wear clean clothes to the hospital/surgery center.   Remember to brush your teeth WITH YOUR REGULAR TOOTHPASTE.             Do not wear jewelry.  Do not shave 48 hours prior to surgery.  Men may shave face.  Do not bring valuables to the hospital.  [facility] is not responsible for any belongings or valuables.  Contacts, dentures or bridgework may not be  worn into surgery.  Leave your suitcase in the car.  After surgery it may be brought to your room.  For patients admitted to the hospital, discharge time will be determined by your treatment team.  Please read over the following fact sheets that you were given. Pain Booklet, Coughing and Deep Breathing, MRSA Information and Surgical Site Infection Prevention

## 2018-01-21 NOTE — Progress Notes (Signed)
Pt's Home health nurse from [facility], called to inform that pt's last dose of plavix was on October 22nd.

## 2018-01-21 NOTE — Anesthesia Preprocedure Evaluation (Addendum)
Anesthesia Evaluation  Patient identified by MRN, date of birth, ID band Patient awake    Reviewed: Allergy & Precautions, H&P , NPO status , Patient's Chart, lab work & pertinent test results  Airway Mallampati: III  TM Distance: >3 FB Neck ROM: Full    Dental no notable dental hx. (+) Edentulous Upper, Edentulous Lower, Dental Advisory Given   Pulmonary COPD, Current Smoker,    Pulmonary exam normal breath sounds clear to auscultation       Cardiovascular hypertension, Pt. on medications  Rhythm:Regular Rate:Normal     Neuro/Psych  Headaches, Anxiety Depression Schizophrenia Dementia    GI/Hepatic Neg liver ROS, GERD  Medicated and Controlled,  Endo/Other  diabetes, Insulin Dependent  Renal/GU negative Renal ROS  negative genitourinary   Musculoskeletal  (+) Arthritis , Osteoarthritis,    Abdominal   Peds  Hematology negative hematology ROS (+)   Anesthesia Other Findings   Reproductive/Obstetrics negative OB ROS                            Anesthesia Physical Anesthesia Plan  ASA: III  Anesthesia Plan: General   Post-op Pain Management:    Induction: Intravenous  PONV Risk Score and Plan: 2 and Ondansetron, Treatment may vary due to age or medical condition and Midazolam  Airway Management Planned: Oral ETT  Additional Equipment:   Intra-op Plan:   Post-operative Plan: Extubation in OR  Informed Consent: I have reviewed the patients History and Physical, chart, labs and discussed the procedure including the risks, benefits and alternatives for the proposed anesthesia with the patient or authorized representative who has indicated his/her understanding and acceptance.   Dental advisory given  Plan Discussed with: CRNA  Anesthesia Plan Comments: (See PAT note written 01/21/2018 by Antionette Poles, PA-C )       Anesthesia Quick Evaluation

## 2018-01-21 NOTE — Progress Notes (Signed)
Anesthesia Chart Review:  Case:  [id number] Date/Time:  01/28/18 1445   Procedure:  RIGHT TOTAL HIP ARTHROPLASTY ANTERIOR APPROACH (Right )   Anesthesia type:  Choice   Pre-op diagnosis:  osteoarthritis right hip   [facility]  [facility] / [facility]   Surgeon:  Kathryne Hitch, MD      DISCUSSION: 84 yo male current smoker. Pertinent hx includes CAD (mod diffuse disease by 01/2014 cath), dilated aortic root (4.7cm by 02/10/14 echo),HTN, IDDMII, aortic aneurysm (infrarenal 3.7 x 3.6cm 04/16/16), COPD, pulmonary nodules (04/16/16), dementia. GERD.  ED visit 07/05/16 (care everywhere) for right sided chest pain. Negative troponin, negative d-dimer, no acute findings on EKG, negative CXR. Felt to be non-cardiac.   Pt was seen by cardiology, Dr. Donnie Aho, 10/15/2016 for preop clearance prior to lumbar laminectomy 11/02/2016. OV note at that time stated:  "from a cardiovascular viewpoint I think it is acceptable to proceed with planned lumbar disc surgery.  His risk of surgery will be somewhat increased to his age but his cardiovascular status appears stable on his current medical regimen.  Should take all of his medications at the time of surgery.  I do not think additional cardiac testing is necessary prior to surgery.  Per pt's home health nurse last dose of Plavix was 01/14/2018.  Pt has not seen Dr. Donnie Aho since previous clearance last year. However, in the absence of new cardiac symptoms I anticipate he can proceed as planned barring acute status change.  VS: BP 137/65   Pulse 88   Temp (!) 36.4 C   Resp 20   Ht 6' (1.829 m)   Wt 86.4 kg   SpO2 99%   BMI 25.84 kg/m   PROVIDERS: Christella Scheuermann, PA-C is PCP   LABS: Labs reviewed: Acceptable for surgery. (all labs ordered are listed, but only abnormal results are displayed)  Labs Reviewed  CBC - Abnormal; Notable for the following components:      Result Value   Hemoglobin 12.3 (*)    All other components within normal limits   HEMOGLOBIN A1C - Abnormal; Notable for the following components:   Hgb A1c MFr Bld 6.2 (*)    All other components within normal limits  SURGICAL PCR SCREEN  GLUCOSE, CAPILLARY  BASIC METABOLIC PANEL     IMAGES: XR Chest PA and Lateral 12/05/2017 (care everywhere):  FINDINGS:  #No pulmonary consolidations. #No detectable pleural effusions. #No pneumothorax. #Intact cardiomediastinal silhouette. #No acute osseous abnormality. Interval development of mild compression fracture of the T11 vertebral body from 02/11/2017. Unchanged compression fracture at T12.  EKG: Will need DOS EKG as last tracing over 89yr old.  10/15/2016 (Dr. Donnie Aho, on pt chart): Sinus rhythm with PACs, left anterior fascicular block and incomplete right bundle branch block.  CV: CT angio aortogram 04/16/16 (care everywhere):  1. Small infrarenal abdominal aortic aneurysm measuring 3.7 x 3.6 cm in the corrected plane with no complicating features. Mild aneurysmal dilatation right common iliac artery and left internal iliac artery with no acute abnormalities. 2. 50% stenosis proximal left main renal artery. 3. Bilateral renal cysts, tiny nonobstructing right upper pole renal stones, duodenal diverticula, colonic diverticulosis, hiatal hernia, spondylosis, mild compression T12 vertebral body, and right inguinal hernia again noted. 4. 7 mm nodule right lung base and 4 mm nodule left lung base.   Echo 02/10/14 (care everywhere): 1. LVsize normal. LV systolic function is low normal. EF 50-55%. LVfilling pattern is impaired.  2. RVis normal in size and  function. 3. There is aortic valve sclerosis. Mild aortic regurgitation. 4. Mildly dilated aortic arch. 5. Mild to moderately dilated aortic root-4.7cm.  significant stenosis seen 6. There is no pericardial effusion.  Cardiac cath 02/09/14(care everywhere):  1. Mid LAD 70% stenosis. D2 90% stenosis. D3 90% stenosis. 2. OM 2 30%stenosis 3. Mid RCA 50%  stenosis. Ectatic aneurysm distal to the 50% lesion. CONCLUSIONS: obstructive 1 vessel. EF 55%. Wall motion normal.  Other notes: LCP for mild Tn leak and SOB. Markedly hypertensive to near 200's throughout case despite Hydralazine. 70% mLAD at bifurcataion of D3 which is a significant vessel - that D3 has 90% far distal. Moderately calcified.90% D2 but small vessel, mild non-obs LCX/OM disease. RCA ectasia with aneurysm distal to 50% lesion.LVEF 55%. EBL 10mL, No specimens. TR for hemostasis.Lesion is notably calcific, and appears chronic in nature. Diffuse disease - not clear that this is the source of his symptoms. Suggest aggressive medical therapy, and if cont'd sx, can consider PCI at a future date.  Past Medical History:  Diagnosis Date  . Anxiety   . Aortic aneurysm (HCC)   . Arthritis   . COPD (chronic obstructive pulmonary disease) (HCC)   . Coronary artery disease   . Dementia [facility])    " my memory is about gone"  . Depression   . Diabetes mellitus without complication (HCC)    Type II  . Dyspnea    with exertion  . GERD (gastroesophageal reflux disease)   . Headache   . History of kidney stones     numerous, passed them  . Hypertension   . Neuromuscular disorder (HCC)    diabetic neuropathy  . Pneumonia     x 2  . Schizophrenia (HCC)    " not had a problem with you."    Past Surgical History:  Procedure Laterality Date  . ADENOIDECTOMY    . CARDIAC CATHETERIZATION    . CATARACT EXTRACTION W/ INTRAOCULAR LENS IMPLANT Bilateral   . CHOLECYSTECTOMY    . COLONOSCOPY    . FOOT FRACTURE SURGERY Right   . LUMBAR LAMINECTOMY/DECOMPRESSION MICRODISCECTOMY Right 11/02/2016   Procedure: Laminectomy and Foraminotomy - Lumbar five-Sacral one - right;  Surgeon: Tia Alert, MD;  Location: [facility] OR;  Service: Neurosurgery;  Laterality: Right;  . TONSILLECTOMY    . VASCULAR SURGERY Right    "Vein striped"    MEDICATIONS: . amLODipine (NORVASC) 5 MG tablet  . aspirin  EC 81 MG tablet  . atorvastatin (LIPITOR) 80 MG tablet  . Cholecalciferol (VITAMIN D3) 2000 units capsule  . clopidogrel (PLAVIX) 75 MG tablet  . docusate sodium (COLACE) 100 MG capsule  . donepezil (ARICEPT) 5 MG tablet  . DULoxetine (CYMBALTA) 60 MG capsule  . finasteride (PROSCAR) 5 MG tablet  . gabapentin (NEURONTIN) 300 MG capsule  . HYDROcodone-acetaminophen (NORCO) 7.5-325 MG tablet  . insulin glargine (LANTUS) 100 UNIT/ML injection  . lisinopril (PRINIVIL,ZESTRIL) 40 MG tablet  . methocarbamol (ROBAXIN) 500 MG tablet  . omeprazole (PRILOSEC) 20 MG capsule  . pregabalin (LYRICA) 150 MG capsule  . PROAIR HFA 108 (90 Base) MCG/ACT inhaler  . traMADol (ULTRAM) 50 MG tablet  . traZODone (DESYREL) 150 MG tablet   No current facility-administered medications for this encounter.     [facility]/Anesthesiology Phone [phone] 01/21/2018 1:48 PM

## 2018-01-22 ENCOUNTER — Other Ambulatory Visit (INDEPENDENT_AMBULATORY_CARE_PROVIDER_SITE_OTHER): Payer: Self-pay

## 2018-01-27 MED ORDER — TRANEXAMIC ACID-NACL 1000-0.7 MG/100ML-% IV SOLN
1000.0000 mg | INTRAVENOUS | Status: AC
  Administered 2018-01-28: 1000 mg via INTRAVENOUS
  Filled 2018-01-27: qty 100

## 2018-01-27 MED ORDER — CEFAZOLIN SODIUM-DEXTROSE 2-4 GM/100ML-% IV SOLN
2.0000 g | INTRAVENOUS | Status: AC
  Administered 2018-01-28: 2 g via INTRAVENOUS
  Filled 2018-01-27: qty 100

## 2018-01-28 ENCOUNTER — Inpatient Hospital Stay (HOSPITAL_COMMUNITY): Payer: Medicare Other | Admitting: Anesthesiology

## 2018-01-28 ENCOUNTER — Inpatient Hospital Stay (HOSPITAL_COMMUNITY): Payer: Medicare Other | Admitting: Physician Assistant

## 2018-01-28 ENCOUNTER — Inpatient Hospital Stay (HOSPITAL_COMMUNITY): Payer: Medicare Other

## 2018-01-28 ENCOUNTER — Inpatient Hospital Stay (HOSPITAL_COMMUNITY)
Admission: RE | Admit: 2018-01-28 | Discharge: 2018-02-01 | DRG: 470 | Disposition: A | Discharge status: Skilled Nursing Facility | Payer: Medicare Other | Attending: Orthopaedic Surgery | Admitting: Orthopaedic Surgery

## 2018-01-28 ENCOUNTER — Encounter (HOSPITAL_COMMUNITY)
Admission: RE | Disposition: A | Discharge status: Skilled Nursing Facility | Payer: Self-pay | Source: Home / Self Care | Attending: Orthopaedic Surgery

## 2018-01-28 DIAGNOSIS — Z8701 Personal history of pneumonia (recurrent): Secondary | ICD-10-CM

## 2018-01-28 DIAGNOSIS — K219 Gastro-esophageal reflux disease without esophagitis: Secondary | ICD-10-CM | POA: Diagnosis present

## 2018-01-28 DIAGNOSIS — Z9049 Acquired absence of other specified parts of digestive tract: Secondary | ICD-10-CM | POA: Diagnosis not present

## 2018-01-28 DIAGNOSIS — F0390 Unspecified dementia without behavioral disturbance: Secondary | ICD-10-CM | POA: Diagnosis present

## 2018-01-28 DIAGNOSIS — K08109 Complete loss of teeth, unspecified cause, unspecified class: Secondary | ICD-10-CM | POA: Diagnosis present

## 2018-01-28 DIAGNOSIS — Z961 Presence of intraocular lens: Secondary | ICD-10-CM | POA: Diagnosis present

## 2018-01-28 DIAGNOSIS — M1611 Unilateral primary osteoarthritis, right hip: Secondary | ICD-10-CM | POA: Diagnosis present

## 2018-01-28 DIAGNOSIS — J449 Chronic obstructive pulmonary disease, unspecified: Secondary | ICD-10-CM | POA: Diagnosis present

## 2018-01-28 DIAGNOSIS — Z9842 Cataract extraction status, left eye: Secondary | ICD-10-CM | POA: Diagnosis not present

## 2018-01-28 DIAGNOSIS — F209 Schizophrenia, unspecified: Secondary | ICD-10-CM | POA: Diagnosis present

## 2018-01-28 DIAGNOSIS — Z96641 Presence of right artificial hip joint: Secondary | ICD-10-CM

## 2018-01-28 DIAGNOSIS — E1140 Type 2 diabetes mellitus with diabetic neuropathy, unspecified: Secondary | ICD-10-CM | POA: Diagnosis present

## 2018-01-28 DIAGNOSIS — Z419 Encounter for procedure for purposes other than remedying health state, unspecified: Secondary | ICD-10-CM

## 2018-01-28 DIAGNOSIS — I2510 Atherosclerotic heart disease of native coronary artery without angina pectoris: Secondary | ICD-10-CM | POA: Diagnosis present

## 2018-01-28 DIAGNOSIS — D62 Acute posthemorrhagic anemia: Secondary | ICD-10-CM | POA: Diagnosis not present

## 2018-01-28 DIAGNOSIS — I10 Essential (primary) hypertension: Secondary | ICD-10-CM | POA: Diagnosis present

## 2018-01-28 DIAGNOSIS — Z9841 Cataract extraction status, right eye: Secondary | ICD-10-CM | POA: Diagnosis not present

## 2018-01-28 DIAGNOSIS — Z720 Tobacco use: Secondary | ICD-10-CM

## 2018-01-28 DIAGNOSIS — Z87442 Personal history of urinary calculi: Secondary | ICD-10-CM

## 2018-01-28 HISTORY — PX: TOTAL HIP ARTHROPLASTY: SHX124

## 2018-01-28 LAB — GLUCOSE, CAPILLARY
Glucose-Capillary: 107 mg/dL — ABNORMAL HIGH (ref 70–99)
Glucose-Capillary: 97 mg/dL (ref 70–99)
Glucose-Capillary: 163 mg/dL — ABNORMAL HIGH (ref 70–99)
Glucose-Capillary: 227 mg/dL — ABNORMAL HIGH (ref 70–99)

## 2018-01-28 SURGERY — ARTHROPLASTY, HIP, TOTAL, ANTERIOR APPROACH
Anesthesia: General | Site: Hip | Laterality: Right

## 2018-01-28 MED ORDER — DONEPEZIL HCL 5 MG PO TABS
5.0000 mg | ORAL_TABLET | Freq: Every day | ORAL | Status: DC
  Administered 2018-01-28 – 2018-01-31 (×4): 5 mg via ORAL
  Filled 2018-01-28 (×4): qty 1

## 2018-01-28 MED ORDER — ACETAMINOPHEN 10 MG/ML IV SOLN
INTRAVENOUS | Status: DC | PRN
  Administered 2018-01-28: 1000 mg via INTRAVENOUS

## 2018-01-28 MED ORDER — MIDAZOLAM HCL 2 MG/2ML IJ SOLN
1.0000 mg | Freq: Once | INTRAMUSCULAR | Status: AC
  Administered 2018-01-28: 1 mg via INTRAVENOUS

## 2018-01-28 MED ORDER — PHENOL 1.4 % MT LIQD: 1.0000 | OROMUCOSAL | Status: DC | PRN

## 2018-01-28 MED ORDER — FENTANYL CITRATE (PF) 250 MCG/5ML IJ SOLN
INTRAMUSCULAR | Status: AC
  Filled 2018-01-28: qty 5

## 2018-01-28 MED ORDER — FENTANYL CITRATE (PF) 100 MCG/2ML IJ SOLN
INTRAMUSCULAR | Status: AC
  Administered 2018-01-28: 50 ug via INTRAVENOUS
  Filled 2018-01-28: qty 2

## 2018-01-28 MED ORDER — ASPIRIN EC 81 MG PO TBEC
81.0000 mg | DELAYED_RELEASE_TABLET | Freq: Every day | ORAL | Status: DC
  Administered 2018-01-28 – 2018-02-01 (×5): 81 mg via ORAL
  Filled 2018-01-28 (×5): qty 1

## 2018-01-28 MED ORDER — CEFAZOLIN SODIUM-DEXTROSE 1-4 GM/50ML-% IV SOLN
1.0000 g | Freq: Four times a day (QID) | INTRAVENOUS | Status: AC
  Administered 2018-01-28 – 2018-01-29 (×2): 1 g via INTRAVENOUS
  Filled 2018-01-28 (×2): qty 50

## 2018-01-28 MED ORDER — AMLODIPINE BESYLATE 5 MG PO TABS
5.0000 mg | ORAL_TABLET | Freq: Every day | ORAL | Status: DC
  Administered 2018-01-29 – 2018-02-01 (×4): 5 mg via ORAL
  Filled 2018-01-28 (×4): qty 1

## 2018-01-28 MED ORDER — MENTHOL 3 MG MT LOZG: 1.0000 | LOZENGE | OROMUCOSAL | Status: DC | PRN

## 2018-01-28 MED ORDER — FENTANYL CITRATE (PF) 250 MCG/5ML IJ SOLN
INTRAMUSCULAR | Status: DC | PRN
  Administered 2018-01-28 (×2): 50 ug via INTRAVENOUS
  Administered 2018-01-28: 100 ug via INTRAVENOUS
  Administered 2018-01-28 (×3): 25 ug via INTRAVENOUS
  Administered 2018-01-28: 50 ug via INTRAVENOUS

## 2018-01-28 MED ORDER — ONDANSETRON HCL 4 MG PO TABS: 4.0000 mg | ORAL_TABLET | Freq: Four times a day (QID) | ORAL | Status: DC | PRN

## 2018-01-28 MED ORDER — FENTANYL CITRATE (PF) 100 MCG/2ML IJ SOLN
50.0000 ug | Freq: Once | INTRAMUSCULAR | Status: AC
  Administered 2018-01-28: 50 ug via INTRAVENOUS

## 2018-01-28 MED ORDER — SODIUM CHLORIDE 0.9 % IV SOLN
INTRAVENOUS | Status: DC
  Administered 2018-01-28: 21:00:00 via INTRAVENOUS

## 2018-01-28 MED ORDER — ALBUTEROL SULFATE (2.5 MG/3ML) 0.083% IN NEBU
INHALATION_SOLUTION | RESPIRATORY_TRACT | Status: AC
  Administered 2018-01-28: 2.5 mg via RESPIRATORY_TRACT
  Filled 2018-01-28: qty 3

## 2018-01-28 MED ORDER — INSULIN GLARGINE 100 UNIT/ML ~~LOC~~ SOLN
50.0000 [IU] | Freq: Every day | SUBCUTANEOUS | Status: DC
  Administered 2018-01-28 – 2018-01-31 (×4): 50 [IU] via SUBCUTANEOUS
  Filled 2018-01-28 (×4): qty 0.5

## 2018-01-28 MED ORDER — SODIUM CHLORIDE 0.9 % IR SOLN
Status: DC | PRN
  Administered 2018-01-28: 3000 mL

## 2018-01-28 MED ORDER — ROCURONIUM BROMIDE 50 MG/5ML IV SOSY
PREFILLED_SYRINGE | INTRAVENOUS | Status: AC
  Filled 2018-01-28: qty 5

## 2018-01-28 MED ORDER — DIPHENHYDRAMINE HCL 12.5 MG/5ML PO ELIX: 12.5000 mg | ORAL_SOLUTION | ORAL | Status: DC | PRN

## 2018-01-28 MED ORDER — DULOXETINE HCL 60 MG PO CPEP
60.0000 mg | ORAL_CAPSULE | Freq: Every day | ORAL | Status: DC
  Administered 2018-01-29 – 2018-02-01 (×4): 60 mg via ORAL
  Filled 2018-01-28 (×3): qty 1
  Filled 2018-01-28: qty 2

## 2018-01-28 MED ORDER — DOCUSATE SODIUM 100 MG PO CAPS
100.0000 mg | ORAL_CAPSULE | Freq: Two times a day (BID) | ORAL | Status: DC
  Administered 2018-01-28 – 2018-02-01 (×8): 100 mg via ORAL
  Filled 2018-01-28 (×8): qty 1

## 2018-01-28 MED ORDER — PROPOFOL 10 MG/ML IV BOLUS
INTRAVENOUS | Status: DC | PRN
  Administered 2018-01-28: 100 mg via INTRAVENOUS

## 2018-01-28 MED ORDER — FENTANYL CITRATE (PF) 100 MCG/2ML IJ SOLN
25.0000 ug | INTRAMUSCULAR | Status: DC | PRN
  Administered 2018-01-28 (×2): 25 ug via INTRAVENOUS
  Administered 2018-01-28: 50 ug via INTRAVENOUS

## 2018-01-28 MED ORDER — ALBUTEROL SULFATE (2.5 MG/3ML) 0.083% IN NEBU
2.5000 mg | INHALATION_SOLUTION | Freq: Four times a day (QID) | RESPIRATORY_TRACT | Status: DC
  Administered 2018-01-28: 2.5 mg via RESPIRATORY_TRACT
  Filled 2018-01-28: qty 3

## 2018-01-28 MED ORDER — VITAMIN D 1000 UNITS PO TABS
2000.0000 [IU] | ORAL_TABLET | Freq: Every day | ORAL | Status: DC
  Administered 2018-01-28 – 2018-02-01 (×5): 2000 [IU] via ORAL
  Filled 2018-01-28 (×2): qty 2

## 2018-01-28 MED ORDER — GABAPENTIN 300 MG PO CAPS
600.0000 mg | ORAL_CAPSULE | Freq: Three times a day (TID) | ORAL | Status: DC
  Administered 2018-01-28 – 2018-02-01 (×11): 600 mg via ORAL
  Filled 2018-01-28 (×11): qty 2

## 2018-01-28 MED ORDER — ONDANSETRON HCL 4 MG/2ML IJ SOLN: 4.0000 mg | Freq: Four times a day (QID) | INTRAMUSCULAR | Status: DC | PRN

## 2018-01-28 MED ORDER — SUGAMMADEX SODIUM 200 MG/2ML IV SOLN
INTRAVENOUS | Status: DC | PRN
  Administered 2018-01-28: 200 mg via INTRAVENOUS

## 2018-01-28 MED ORDER — ALBUTEROL SULFATE (2.5 MG/3ML) 0.083% IN NEBU
2.5000 mg | INHALATION_SOLUTION | Freq: Three times a day (TID) | RESPIRATORY_TRACT | Status: DC
  Administered 2018-01-29 – 2018-01-30 (×6): 2.5 mg via RESPIRATORY_TRACT
  Filled 2018-01-28 (×7): qty 3

## 2018-01-28 MED ORDER — LACTATED RINGERS IV SOLN
INTRAVENOUS | Status: DC | PRN
  Administered 2018-01-28: 14:00:00 via INTRAVENOUS

## 2018-01-28 MED ORDER — CLOPIDOGREL BISULFATE 75 MG PO TABS
75.0000 mg | ORAL_TABLET | Freq: Every day | ORAL | Status: DC
  Administered 2018-01-28 – 2018-02-01 (×5): 75 mg via ORAL
  Filled 2018-01-28 (×5): qty 1

## 2018-01-28 MED ORDER — METOPROLOL TARTRATE 5 MG/5ML IV SOLN
INTRAVENOUS | Status: AC
  Filled 2018-01-28: qty 5

## 2018-01-28 MED ORDER — FINASTERIDE 5 MG PO TABS
5.0000 mg | ORAL_TABLET | Freq: Every day | ORAL | Status: DC
  Administered 2018-01-28 – 2018-01-31 (×4): 5 mg via ORAL
  Filled 2018-01-28 (×4): qty 1

## 2018-01-28 MED ORDER — ATORVASTATIN CALCIUM 80 MG PO TABS
80.0000 mg | ORAL_TABLET | Freq: Every day | ORAL | Status: DC
  Administered 2018-01-29 – 2018-01-31 (×3): 80 mg via ORAL
  Filled 2018-01-28 (×3): qty 1

## 2018-01-28 MED ORDER — POLYETHYLENE GLYCOL 3350 17 G PO PACK: 17.0000 g | PACK | Freq: Every day | ORAL | Status: DC | PRN

## 2018-01-28 MED ORDER — MIDAZOLAM HCL 2 MG/2ML IJ SOLN
INTRAMUSCULAR | Status: AC
  Administered 2018-01-28: 1 mg via INTRAVENOUS
  Filled 2018-01-28: qty 2

## 2018-01-28 MED ORDER — PHENYLEPHRINE 40 MCG/ML (10ML) SYRINGE FOR IV PUSH (FOR BLOOD PRESSURE SUPPORT)
PREFILLED_SYRINGE | INTRAVENOUS | Status: AC
  Filled 2018-01-28: qty 10

## 2018-01-28 MED ORDER — ACETAMINOPHEN 325 MG PO TABS
325.0000 mg | ORAL_TABLET | Freq: Four times a day (QID) | ORAL | Status: DC | PRN
  Administered 2018-01-31 (×2): 650 mg via ORAL
  Filled 2018-01-28 (×2): qty 2

## 2018-01-28 MED ORDER — PREGABALIN 75 MG PO CAPS
150.0000 mg | ORAL_CAPSULE | Freq: Two times a day (BID) | ORAL | Status: DC
  Administered 2018-01-28 – 2018-02-01 (×8): 150 mg via ORAL
  Filled 2018-01-28 (×8): qty 2

## 2018-01-28 MED ORDER — ALBUTEROL SULFATE (2.5 MG/3ML) 0.083% IN NEBU
2.5000 mg | INHALATION_SOLUTION | Freq: Four times a day (QID) | RESPIRATORY_TRACT | Status: DC | PRN
  Administered 2018-01-28: 2.5 mg via RESPIRATORY_TRACT

## 2018-01-28 MED ORDER — OXYCODONE HCL 5 MG PO TABS
5.0000 mg | ORAL_TABLET | ORAL | Status: DC | PRN
  Administered 2018-01-28: 10 mg via ORAL
  Filled 2018-01-28: qty 2

## 2018-01-28 MED ORDER — ONDANSETRON HCL 4 MG/2ML IJ SOLN
INTRAMUSCULAR | Status: AC
  Filled 2018-01-28: qty 2

## 2018-01-28 MED ORDER — OXYCODONE HCL 5 MG PO TABS
10.0000 mg | ORAL_TABLET | ORAL | Status: DC | PRN
  Administered 2018-01-29: 15 mg via ORAL
  Filled 2018-01-28: qty 3

## 2018-01-28 MED ORDER — 0.9 % SODIUM CHLORIDE (POUR BTL) OPTIME
TOPICAL | Status: DC | PRN
  Administered 2018-01-28: 1000 mL

## 2018-01-28 MED ORDER — METOCLOPRAMIDE HCL 5 MG/ML IJ SOLN: 5.0000 mg | Freq: Three times a day (TID) | INTRAMUSCULAR | Status: DC | PRN

## 2018-01-28 MED ORDER — LIDOCAINE 2% (20 MG/ML) 5 ML SYRINGE
INTRAMUSCULAR | Status: DC | PRN
  Administered 2018-01-28: 60 mg via INTRAVENOUS

## 2018-01-28 MED ORDER — LIDOCAINE 2% (20 MG/ML) 5 ML SYRINGE
INTRAMUSCULAR | Status: AC
  Filled 2018-01-28: qty 20

## 2018-01-28 MED ORDER — ROCURONIUM BROMIDE 10 MG/ML (PF) SYRINGE
PREFILLED_SYRINGE | INTRAVENOUS | Status: DC | PRN
  Administered 2018-01-28: 50 mg via INTRAVENOUS

## 2018-01-28 MED ORDER — FENTANYL CITRATE (PF) 100 MCG/2ML IJ SOLN
INTRAMUSCULAR | Status: AC
  Filled 2018-01-28: qty 2

## 2018-01-28 MED ORDER — PANTOPRAZOLE SODIUM 40 MG PO TBEC
40.0000 mg | DELAYED_RELEASE_TABLET | Freq: Every day | ORAL | Status: DC
  Administered 2018-01-28 – 2018-02-01 (×5): 40 mg via ORAL
  Filled 2018-01-28 (×5): qty 1

## 2018-01-28 MED ORDER — METHOCARBAMOL 500 MG PO TABS
500.0000 mg | ORAL_TABLET | Freq: Four times a day (QID) | ORAL | Status: DC | PRN
  Administered 2018-01-30: 500 mg via ORAL
  Filled 2018-01-28: qty 1

## 2018-01-28 MED ORDER — ESMOLOL HCL 100 MG/10ML IV SOLN
INTRAVENOUS | Status: AC
  Filled 2018-01-28: qty 10

## 2018-01-28 MED ORDER — ACETAMINOPHEN 10 MG/ML IV SOLN
INTRAVENOUS | Status: AC
  Filled 2018-01-28: qty 100

## 2018-01-28 MED ORDER — HYDROMORPHONE HCL 1 MG/ML IJ SOLN: 0.5000 mg | INTRAMUSCULAR | Status: DC | PRN

## 2018-01-28 MED ORDER — METOCLOPRAMIDE HCL 5 MG PO TABS: 5.0000 mg | ORAL_TABLET | Freq: Three times a day (TID) | ORAL | Status: DC | PRN

## 2018-01-28 SURGICAL SUPPLY — 56 items
APL SKNCLS STERI-STRIP NONHPOA (GAUZE/BANDAGES/DRESSINGS) ×1
ARTICULEZE HEAD (Hips) ×2 IMPLANT
BENZOIN TINCTURE PRP APPL 2/3 (GAUZE/BANDAGES/DRESSINGS) ×2 IMPLANT
BLADE CLIPPER SURG (BLADE) IMPLANT
BLADE SAW SGTL 18X1.27X75 (BLADE) ×2 IMPLANT
COVER SURGICAL LIGHT HANDLE (MISCELLANEOUS) ×2 IMPLANT
COVER WAND RF STERILE (DRAPES) ×2 IMPLANT
CUP ACET PNNCL SECTR W/GRIP 56 (Hips) IMPLANT
DRAPE C-ARM 42X72 X-RAY (DRAPES) ×2 IMPLANT
DRAPE STERI IOBAN 125X83 (DRAPES) ×2 IMPLANT
DRAPE U-SHAPE 47X51 STRL (DRAPES) ×6 IMPLANT
DRSG AQUACEL AG ADV 3.5X10 (GAUZE/BANDAGES/DRESSINGS) ×2 IMPLANT
DURAPREP 26ML APPLICATOR (WOUND CARE) ×2 IMPLANT
ELECT BLADE 4.0 EZ CLEAN MEGAD (MISCELLANEOUS) ×2
ELECT BLADE 6.5 EXT (BLADE) IMPLANT
ELECT REM PT RETURN 9FT ADLT (ELECTROSURGICAL) ×2
ELECTRODE BLDE 4.0 EZ CLN MEGD (MISCELLANEOUS) ×1 IMPLANT
ELECTRODE REM PT RTRN 9FT ADLT (ELECTROSURGICAL) ×1 IMPLANT
FACESHIELD WRAPAROUND (MASK) ×4 IMPLANT
FACESHIELD WRAPAROUND OR TEAM (MASK) ×2 IMPLANT
GAUZE XEROFORM 1X8 LF (GAUZE/BANDAGES/DRESSINGS) ×1 IMPLANT
GLOVE BIOGEL PI IND STRL 8 (GLOVE) ×2 IMPLANT
GLOVE BIOGEL PI INDICATOR 8 (GLOVE) ×2
GLOVE ECLIPSE 8.0 STRL XLNG CF (GLOVE) ×2 IMPLANT
GLOVE ORTHO TXT STRL SZ7.5 (GLOVE) ×4 IMPLANT
GOWN STRL REUS W/ TWL LRG LVL3 (GOWN DISPOSABLE) ×2 IMPLANT
GOWN STRL REUS W/ TWL XL LVL3 (GOWN DISPOSABLE) ×2 IMPLANT
GOWN STRL REUS W/TWL LRG LVL3 (GOWN DISPOSABLE) ×4
GOWN STRL REUS W/TWL XL LVL3 (GOWN DISPOSABLE) ×4
HANDPIECE INTERPULSE COAX TIP (DISPOSABLE) ×2
HEAD ARTICULEZE (Hips) IMPLANT
KIT BASIN OR (CUSTOM PROCEDURE TRAY) ×2 IMPLANT
KIT TURNOVER KIT B (KITS) ×2 IMPLANT
LINER NEUTRAL 52MMX36MMX56N (Liner) ×1 IMPLANT
MANIFOLD NEPTUNE II (INSTRUMENTS) ×2 IMPLANT
NS IRRIG 1000ML POUR BTL (IV SOLUTION) ×2 IMPLANT
PACK TOTAL JOINT (CUSTOM PROCEDURE TRAY) ×2 IMPLANT
PAD ARMBOARD 7.5X6 YLW CONV (MISCELLANEOUS) ×2 IMPLANT
PINN SECTOR W/GRIP ACE CUP 56 (Hips) ×2 IMPLANT
SET HNDPC FAN SPRY TIP SCT (DISPOSABLE) ×1 IMPLANT
STAPLER VISISTAT 35W (STAPLE) IMPLANT
STEM CORAIL KA12 (Stem) ×1 IMPLANT
STRIP CLOSURE SKIN 1/2X4 (GAUZE/BANDAGES/DRESSINGS) ×4 IMPLANT
SUT ETHIBOND NAB CT1 #1 30IN (SUTURE) ×2 IMPLANT
SUT MNCRL AB 4-0 PS2 18 (SUTURE) IMPLANT
SUT VIC AB 0 CT1 27 (SUTURE) ×2
SUT VIC AB 0 CT1 27XBRD ANBCTR (SUTURE) ×1 IMPLANT
SUT VIC AB 1 CT1 27 (SUTURE) ×2
SUT VIC AB 1 CT1 27XBRD ANBCTR (SUTURE) ×1 IMPLANT
SUT VIC AB 2-0 CT1 27 (SUTURE) ×2
SUT VIC AB 2-0 CT1 TAPERPNT 27 (SUTURE) ×1 IMPLANT
TOWEL OR 17X24 6PK STRL BLUE (TOWEL DISPOSABLE) ×2 IMPLANT
TOWEL OR 17X26 10 PK STRL BLUE (TOWEL DISPOSABLE) ×2 IMPLANT
TRAY CATH 16FR W/PLASTIC CATH (SET/KITS/TRAYS/PACK) IMPLANT
TRAY FOLEY MTR SLVR 16FR STAT (SET/KITS/TRAYS/PACK) IMPLANT
WATER STERILE IRR 1000ML POUR (IV SOLUTION) ×4 IMPLANT

## 2018-01-28 NOTE — Anesthesia Procedure Notes (Signed)
Procedure Name: Intubation Date/Time: 01/28/2018 4:22 PM Performed by: Mariea Clonts, CRNA Pre-anesthesia Checklist: Patient identified, Emergency Drugs available, Suction available and Patient being monitored Patient Re-evaluated:Patient Re-evaluated prior to induction Oxygen Delivery Method: Circle System Utilized Preoxygenation: Pre-oxygenation with 100% oxygen Induction Type: IV induction Ventilation: Mask ventilation without difficulty Laryngoscope Size: Mac and 4 Grade View: Grade II Tube type: Oral Tube size: 7.5 mm Number of attempts: 1 Airway Equipment and Method: Stylet and Oral airway Placement Confirmation: ETT inserted through vocal cords under direct vision,  positive ETCO2 and breath sounds checked- equal and bilateral Tube secured with: Tape Dental Injury: Teeth and Oropharynx as per pre-operative assessment

## 2018-01-28 NOTE — Op Note (Signed)
NAMESEVON, Chad Cameron MEDICAL RECORD ZO:[id number] ACCOUNT [id number] DATE OF BIRTH:February 23, 1932 FACILITY: [facility] LOCATION: [facility] PHYSICIAN:Avrie Kedzierski Aretha Parrot, MD  OPERATIVE REPORT  DATE OF PROCEDURE:  01/28/2018  PREOPERATIVE DIAGNOSES:  Primary osteoarthritis and degenerative joint disease, right hip.  POSTOPERATIVE DIAGNOSES:  Primary osteoarthritis and degenerative joint disease, right hip.  PROCEDURE:  Right total hip arthroplasty, direct anterior approach.  IMPLANTS:  DePuy Sector Gription acetabular component size 56, size 36+0 polyethylene liner, size 12 Corail femoral component with standard offset, size 36+5 metal hip ball.  SURGEON:  Vanita Panda. Magnus Ivan, MD  ASSISTANT:  Richardean Canal, PA-C  ANESTHESIA:  General.  ESTIMATED BLOOD LOSS:  200 mL.  ANTIBIOTICS:  Two grams IV Ancef.  COMPLICATIONS:  None.  INDICATIONS:  The patient is a very active 84 year old gentleman with debilitating arthritis involving his right hip.  His right leg is shorter than his left side.  His pain is daily, and it is detrimentally affecting his activities of daily living, his  quality of life and his mobility.  His x-rays also show severe end-stage arthritis of his right hip.  At this point, he wishes to proceed with a total hip arthroplasty given his daily pain and the detrimental impact this has had on his activities of  daily living, his mobility and his quality of life.  He understands the risk of acute blood loss anemia, nerve and vessel injury, fracture, infection, dislocation, DVT and implant failure.  He understands our goals are to decrease pain, improve mobility  and overall improve quality of life.  DESCRIPTION OF PROCEDURE:  After informed consent was obtained and appropriate right hip was marked, he was brought to the operating room where general anesthesia was obtained and placed on a stretcher.  We did assess his leg lengths and found he was  shorter on his right operative side  then his left side.  Traction boots were placed on both his feet.  Next, he was placed supine on the Hana fracture table with a perineal post in place and both legs in an in-line skeletal traction device and no  traction applied.  His right operative hip was prepped and draped with DuraPrep and sterile drapes.  A time-out was called, and he was identified as correct patient, correct right hip.  We then made an incision just inferior and posterior to the anterior  iliac spine and carried this obliquely down the leg.  We dissected down the tensor fascia lata muscle.  Tensor fascia was then divided longitudinally to proceed with direct anterior approach to the hip.  We identified and cauterized circumflex vessels.   I then identified the hip capsule, opened up the hip capsule in an L-type format, finding moderate joint effusion and significant periarticular osteophytes around the right hip femoral head and acetabulum.  We made our femoral neck cut with an  oscillating saw just proximal to the lesser trochanter and completed this with an osteotome.  We placed a corkscrew guide in the femoral head, removed the femoral head in its entirety and found a large area devoid of cartilage.  On the femoral head and  in the acetabulum, we removed remnants of the acetabular labrum and other debris and placed a bent Hohmann over the medial acetabular rim and then began reaming under direct visualization from a size 44 reamer, going in stepwise increments to a size 55  with all reamers under direct visualization, the last reamer under direct fluoroscopy so we could obtain our depth of reaming, our inclination and  anteversion.  We then placed the real DePuy Sector Gription acetabular component size 56 under direct  visualization and fluoroscopy, and we were pleased with the angle, inclination and anteversion.  We then placed the real DePuy Sector Gription acetabular component, which was a 36+0 for a size 56 acetabular  component.  Attention was then turned to the  femur.  With the leg externally rotated to 120 degrees, extended and adducted, we were able to place a Mueller retractor medially and Hohmann retractor around the greater trochanter.  We released the lateral joint capsule and used a box-cutting osteotome  to enter the femoral canal and a rongeur to lateralize.  Then began broaching from a size 8 broach using Corail broaching system going to a size 12.  With the size 12 in place, we trialed a standard offset femoral neck and a 36+1.5 hip ball, reduced  this in the acetabulum.  We felt it was stable, but we needed just a little bit more offset and leg length.  We dislocated the hip and removed the trial components.  We placed the real Corail femoral component size 12 and the real 36+5 metal hip ball  again and reduced this in the acetabulum.  We appreciated stability in his leg length and offset.  We then irrigated the soft tissue with normal saline solution using pulsatile lavage.  We closed the joint capsule with interrupted #1 Ethibond suture,  followed by running #1 Vicryl suture in the tensor fascia, 0 Vicryl in the deep tissue, 2-0 Vicryl for subcutaneous tissue and staples on the skin.  A Xeroform well-padded sterile dressing was applied.  He was taken off the Hana table, awakened,  extubated, and taken to recovery room in stable condition.  All final counts were correct.  There were no complications noted.  Of note, Rexene Edison, PA-C, assisted the entire case.  Assistance was crucial for facilitating all aspects of this case.  LN/NUANCE  D:01/28/2018 T:01/28/2018 JOB:[id number]

## 2018-01-28 NOTE — Brief Op Note (Signed)
01/28/2018  5:53 PM  PATIENT:  Lois Huxley  84 y.o. male  PRE-OPERATIVE DIAGNOSIS:  osteoarthritis right hip  POST-OPERATIVE DIAGNOSIS:  osteoarthritis right hip  PROCEDURE:  Procedure(s): RIGHT TOTAL HIP ARTHROPLASTY ANTERIOR APPROACH (Right)  SURGEON:  Surgeon(s) and Role:    Kathryne Hitch, MD - Primary  PHYSICIAN ASSISTANT: Rexene Edison, PA-C assisted  ANESTHESIA:   general  EBL:  200 mL   COUNTS:  YES  DICTATION: .Other Dictation: Dictation Number [id number]  PLAN OF CARE: Admit to inpatient   PATIENT DISPOSITION:  PACU - hemodynamically stable.   Delay start of Pharmacological VTE agent (>24hrs) due to surgical blood loss or risk of bleeding: no

## 2018-01-28 NOTE — H&P (Signed)
TOTAL HIP ADMISSION H&P  Patient is admitted for right total hip arthroplasty.  Subjective:  Chief Complaint: right hip pain  HPI: Chad Cameron, 84 y.o. male, has a history of pain and functional disability in the right hip(s) due to arthritis and patient has failed non-surgical conservative treatments for greater than 12 weeks to include NSAID's and/or analgesics, corticosteriod injections, use of assistive devices, weight reduction as appropriate and activity modification.  Onset of symptoms was gradual starting 2 years ago with gradually worsening course since that time.The patient noted no past surgery on the right hip(s).  Patient currently rates pain in the right hip at 10 out of 10 with activity. Patient has night pain, worsening of pain with activity and weight bearing, trendelenberg gait, pain that interfers with activities of daily living and pain with passive range of motion. Patient has evidence of subchondral cysts, subchondral sclerosis, periarticular osteophytes and joint space narrowing by imaging studies. This condition presents safety issues increasing the risk of falls.  There is no current active infection.  Patient Active Problem List   Diagnosis Date Noted  . Unilateral primary osteoarthritis, right hip 01/01/2018  . S/P lumbar laminectomy 11/02/2016   Past Medical History:  Diagnosis Date  . Anxiety   . Aortic aneurysm (HCC)   . Arthritis   . COPD (chronic obstructive pulmonary disease) (HCC)   . Coronary artery disease   . Dementia [facility])    " my memory is about gone"  . Depression   . Diabetes mellitus without complication (HCC)    Type II  . Dyspnea    with exertion  . GERD (gastroesophageal reflux disease)   . Headache   . History of kidney stones     numerous, passed them  . Hypertension   . Neuromuscular disorder (HCC)    diabetic neuropathy  . Pneumonia     x 2  . Schizophrenia (HCC)    " not had a problem with you."    Past Surgical History:   Procedure Laterality Date  . ADENOIDECTOMY    . CARDIAC CATHETERIZATION    . CATARACT EXTRACTION W/ INTRAOCULAR LENS IMPLANT Bilateral   . CHOLECYSTECTOMY    . COLONOSCOPY    . FOOT FRACTURE SURGERY Right   . LUMBAR LAMINECTOMY/DECOMPRESSION MICRODISCECTOMY Right 11/02/2016   Procedure: Laminectomy and Foraminotomy - Lumbar five-Sacral one - right;  Surgeon: Tia Alert, MD;  Location: [facility] OR;  Service: Neurosurgery;  Laterality: Right;  . TONSILLECTOMY    . VASCULAR SURGERY Right    "Vein striped"    Current Facility-Administered Medications  Medication Dose Route Frequency Provider Last Rate Last Dose  . ceFAZolin (ANCEF) IVPB 2g/100 mL premix  2 g Intravenous To SS-Surg Kathryne Hitch, MD      . tranexamic acid (CYKLOKAPRON) IVPB 1,000 mg  1,000 mg Intravenous To OR Kathryne Hitch, MD       No Known Allergies  Social History   Tobacco Use  . Smoking status: Current Every Day Smoker    Years: 76.00    Types: Cigars  . Smokeless tobacco: Never Used  . Tobacco comment: little cigars - 10 a day  Substance Use Topics  . Alcohol use: Yes    Alcohol/week: 7.0 standard drinks    Types: 7 Shots of liquor per week    Family History  Problem Relation Age of Onset  . Parkinson's disease Mother   . Ulcers Father   . Cancer - Other Father  Review of Systems  Musculoskeletal: Positive for joint pain.  All other systems reviewed and are negative.   Objective:  Physical Exam  Constitutional: He is oriented to person, place, and time. He appears well-developed and well-nourished.  Eyes: Pupils are equal, round, and reactive to light.  Neck: Normal range of motion. Neck supple.  Cardiovascular: Normal rate.  Respiratory: Effort normal.  GI: Soft.  Musculoskeletal:       Right hip: He exhibits decreased range of motion, decreased strength, tenderness and bony tenderness.  Neurological: He is alert and oriented to person, place, and time.  Skin: Skin is  warm and dry.  Psychiatric: He has a normal mood and affect.    Vital signs in last 24 hours:    Labs:   Estimated body mass index is 25.84 kg/m as calculated from the following:   Height as of 01/20/18: 6' (1.829 m).   Weight as of 01/20/18: 86.4 kg.   Imaging Review Plain radiographs demonstrate severe degenerative joint disease of the right hip(s). The bone quality appears to be good for age and reported activity level.    Preoperative templating of the joint replacement has been completed, documented, and submitted to the Operating Room personnel in order to optimize intra-operative equipment management.     Assessment/Plan:  End stage arthritis, right hip(s)  The patient history, physical examination, clinical judgement of the provider and imaging studies are consistent with end stage degenerative joint disease of the right hip(s) and total hip arthroplasty is deemed medically necessary. The treatment options including medical management, injection therapy, arthroscopy and arthroplasty were discussed at length. The risks and benefits of total hip arthroplasty were presented and reviewed. The risks due to aseptic loosening, infection, stiffness, dislocation/subluxation,  thromboembolic complications and other imponderables were discussed.  The patient acknowledged the explanation, agreed to proceed with the plan and consent was signed. Patient is being admitted for inpatient treatment for surgery, pain control, PT, OT, prophylactic antibiotics, VTE prophylaxis, progressive ambulation and ADL's and discharge planning.The patient is planning to be discharged home with home health services vs short term skilled nursing.

## 2018-01-28 NOTE — Anesthesia Postprocedure Evaluation (Signed)
Anesthesia Post Note  Patient: Chad Cameron  Procedure(s) Performed: RIGHT TOTAL HIP ARTHROPLASTY ANTERIOR APPROACH (Right Hip)     Patient location during evaluation: PACU Anesthesia Type: General Level of consciousness: awake and alert and patient cooperative Pain management: pain level controlled Vital Signs Assessment: post-procedure vital signs reviewed and stable Respiratory status: spontaneous breathing, nonlabored ventilation, respiratory function stable and patient connected to nasal cannula oxygen Cardiovascular status: blood pressure returned to baseline and stable Postop Assessment: no apparent nausea or vomiting Anesthetic complications: no    Last Vitals:  Vitals:   01/28/18 1830 01/28/18 1845  BP: (!) 165/80 (!) 154/91  Pulse: 93 99  Resp: 20 20  Temp:    SpO2: 91% 97%    Last Pain:  Vitals:   01/28/18 1830  TempSrc:   PainSc: Asleep                 Hollie Wojahn,E. Adrina Armijo

## 2018-01-28 NOTE — Transfer of Care (Signed)
Immediate Anesthesia Transfer of Care Note  Patient: Chad Cameron  Procedure(s) Performed: RIGHT TOTAL HIP ARTHROPLASTY ANTERIOR APPROACH (Right Hip)  Patient Location: PACU  Anesthesia Type:General  Level of Consciousness: drowsy  Airway & Oxygen Therapy: Patient Spontanous Breathing and Patient connected to face mask oxygen  Post-op Assessment: Report given to RN and Post -op Vital signs reviewed and stable  Post vital signs: Reviewed, stable  Last Vitals:  Vitals Value Taken Time  BP 172/91 01/28/2018  6:05 PM  Temp    Pulse 102 01/28/2018  6:09 PM  Resp 19 01/28/2018  6:09 PM  SpO2 99 % 01/28/2018  6:09 PM  Vitals shown include unvalidated device data.  Last Pain:  Vitals:   01/28/18 1236  TempSrc:   PainSc: 8          Complications: No apparent anesthesia complications

## 2018-01-28 NOTE — Progress Notes (Signed)
Patient stated he is very anxious about surgery.  Dr. Bradley Ferris notified.  No new orders received.

## 2018-01-29 ENCOUNTER — Encounter (HOSPITAL_COMMUNITY): Payer: Self-pay | Admitting: Orthopaedic Surgery

## 2018-01-29 ENCOUNTER — Other Ambulatory Visit: Payer: Self-pay

## 2018-01-29 LAB — CBC
HCT: 32.7 % — ABNORMAL LOW (ref 39.0–52.0)
Hemoglobin: 10 g/dL — ABNORMAL LOW (ref 13.0–17.0)
MCH: 27.1 pg (ref 26.0–34.0)
MCHC: 30.6 g/dL (ref 30.0–36.0)
MCV: 88.6 fL (ref 80.0–100.0)
Platelets: 270 10*3/uL (ref 150–400)
RBC: 3.69 MIL/uL — ABNORMAL LOW (ref 4.22–5.81)
RDW: 13 % (ref 11.5–15.5)
WBC: 9.4 10*3/uL (ref 4.0–10.5)
nRBC: 0 % (ref 0.0–0.2)

## 2018-01-29 LAB — BASIC METABOLIC PANEL
Anion gap: 5 (ref 5–15)
BUN: 9 mg/dL (ref 8–23)
CO2: 32 mmol/L (ref 22–32)
Calcium: 8.5 mg/dL — ABNORMAL LOW (ref 8.9–10.3)
Chloride: 101 mmol/L (ref 98–111)
Creatinine, Ser: 1.04 mg/dL (ref 0.61–1.24)
GFR calc Af Amer: >60 mL/min (ref >60)
GFR calc non Af Amer: >60 mL/min (ref >60)
Glucose, Bld: 181 mg/dL — ABNORMAL HIGH (ref 70–99)
Potassium: 5.2 mmol/L — ABNORMAL HIGH (ref 3.5–5.1)
Sodium: 138 mmol/L (ref 135–145)

## 2018-01-29 LAB — GLUCOSE, CAPILLARY
Glucose-Capillary: 113 mg/dL — ABNORMAL HIGH (ref 70–99)
Glucose-Capillary: 169 mg/dL — ABNORMAL HIGH (ref 70–99)
Glucose-Capillary: 144 mg/dL — ABNORMAL HIGH (ref 70–99)

## 2018-01-29 MED ORDER — HYDROCODONE-ACETAMINOPHEN 5-325 MG PO TABS
1.0000 | ORAL_TABLET | ORAL | Status: DC | PRN
  Administered 2018-01-30 – 2018-01-31 (×3): 1 via ORAL
  Administered 2018-02-01 (×2): 2 via ORAL
  Filled 2018-01-29 (×3): qty 1
  Filled 2018-01-29: qty 2
  Filled 2018-01-29: qty 1
  Filled 2018-01-29: qty 2

## 2018-01-29 MED ORDER — INSULIN ASPART 100 UNIT/ML ~~LOC~~ SOLN: 0.0000 [IU] | Freq: Every day | SUBCUTANEOUS | Status: DC

## 2018-01-29 MED ORDER — INSULIN ASPART 100 UNIT/ML ~~LOC~~ SOLN
0.0000 [IU] | Freq: Three times a day (TID) | SUBCUTANEOUS | Status: DC
  Administered 2018-01-29 – 2018-01-30 (×2): 3 [IU] via SUBCUTANEOUS

## 2018-01-29 NOTE — Plan of Care (Signed)
  Problem: Education: Goal: Knowledge of General Education information will improve Description: Including pain rating scale, medication(s)/side effects and non-pharmacologic comfort measures Outcome: Progressing   Problem: Health Behavior/Discharge Planning: Goal: Ability to manage health-related needs will improve Outcome: Progressing   Problem: Clinical Measurements: Goal: Ability to maintain clinical measurements within normal limits will improve Outcome: Progressing Goal: Will remain free from infection Outcome: Progressing Goal: Diagnostic test results will improve Outcome: Progressing Goal: Respiratory complications will improve Outcome: Progressing Goal: Cardiovascular complication will be avoided Outcome: Progressing   Problem: Skin Integrity: Goal: Risk for impaired skin integrity will decrease Outcome: Progressing   Problem: Safety: Goal: Ability to remain free from injury will improve Outcome: Progressing   Problem: Pain Managment: Goal: General experience of comfort will improve Outcome: Progressing   Problem: Elimination: Goal: Will not experience complications related to bowel motility Outcome: Progressing Goal: Will not experience complications related to urinary retention Outcome: Progressing   Problem: Coping: Goal: Level of anxiety will decrease Outcome: Progressing   

## 2018-01-29 NOTE — Progress Notes (Signed)
Subjective: 1 Day Post-Op Procedure(s) (LRB): RIGHT TOTAL HIP ARTHROPLASTY ANTERIOR APPROACH (Right) Patient reports pain as moderate.  Minimal acute blood loss anemia from his surgery, but tolerating well.  Objective: Vital signs in last 24 hours: Temp:  [97.6 F (36.4 C)-98.8 F (37.1 C)] 98.8 F (37.1 C) (11/06 0300) Pulse Rate:  [79-102] 94 (11/06 0300) Resp:  [11-22] 16 (11/06 0300) BP: (133-169)/(66-92) 138/88 (11/06 0300) SpO2:  [91 %-100 %] 100 % (11/06 0300) Weight:  [87.1 kg] 87.1 kg (11/05 1155)  Intake/Output from previous day: 11/05 0701 - 11/06 0700 In: 1555.1 [I.V.:1492.3; IV Piggyback:62.8] Out: 400 [Urine:200; Blood:200] Intake/Output this shift: Total I/O In: -  Out: 300 [Urine:300]  Recent Labs    01/29/18 0251  HGB 10.0*   Recent Labs    01/29/18 0251  WBC 9.4  RBC 3.69*  HCT 32.7*  PLT 270   Recent Labs    01/29/18 0251  NA 138  K 5.2*  CL 101  CO2 32  BUN 9  CREATININE 1.04  GLUCOSE 181*  CALCIUM 8.5*   No results for input(s): LABPT, INR in the last 72 hours.  Sensation intact distally Intact pulses distally Dorsiflexion/Plantar flexion intact Incision: scant drainage  Assessment/Plan: 1 Day Post-Op Procedure(s) (LRB): RIGHT TOTAL HIP ARTHROPLASTY ANTERIOR APPROACH (Right) Up with therapy    Kathryne Hitch 01/29/2018, 7:58 AM

## 2018-01-29 NOTE — Progress Notes (Signed)
Physical Therapy Treatment Patient Details Name: Chad Cameron MRN: [id number] DOB: 01/30/1932 Today's Date: 01/29/2018    History of Present Illness 84yo male who received elective R anterior THA on 01/28/18. PMH anxiety, aortic aneurysm, COPD, dementia, DM, HTN, neuropathy, schizophrenia, cardiac cath, hx foot fracture surgery, lumbar laminectomy, hx vascular surgery     PT Comments    Returned for afternoon session co-treat with OT for safety during mobility and transfers. Patient received in bed, more alert than this morning and more talkative, but still with impaired cognition; unsure of baseline level of function cognitively and recommend speech consult. He requires MaxA+2 for bed mobility and demonstrates strong posterior lean initially requiring Min-ModA for upright but faded to min guard. Able to perform functional transfers with ModA+2 and standard walker, and able to take pivotal steps to chair with MinA+2 and standard walker.  He was left up in the chair with all needs met, nursing staff aware of patient status and of PT recommendations for +2 transfer with stedy for back to bed after dinner. Continue to strongly recommend ST-SNF due to safety concerns and difficulty with mobility.    Follow Up Recommendations  Follow surgeon's recommendation for DC plan and follow-up therapies;SNF     Equipment Recommendations  Other (comment)(defer to next venue )    Recommendations for Other Services Speech consult     Precautions / Restrictions Precautions Precautions: Fall;Anterior Hip Precaution Booklet Issued: No Restrictions Weight Bearing Restrictions: Yes RLE Weight Bearing: Weight bearing as tolerated    Mobility  Bed Mobility Overal bed mobility: Needs Assistance Bed Mobility: Supine to Sit     Supine to sit: Max assist;+2 for physical assistance     General bed mobility comments: +2 for helicopter technique to pivot to edge of bed, patient pain limited and yelling out  during transfer; initially required ModA to maintain upright due to posterior lean but faded to min guard   Transfers Overall transfer level: Needs assistance Equipment used: Standard walker Transfers: Sit to/from Stand;Stand Pivot Transfers Sit to Stand: Mod assist;+2 physical assistance Stand pivot transfers: Min assist;+2 physical assistance       General transfer comment: ModA+2 to come to full standing position at EOB, MinA+2 to assist in moving standard walker and for general balance and safety. Mod-max verbal cues provided during transfer.   Ambulation/Gait             General Gait Details: deferred but able to take pivotal steps to recliner    Stairs             Wheelchair Mobility    Modified Rankin (Stroke Patients Only)       Balance Overall balance assessment: Needs assistance Sitting-balance support: Feet supported;Bilateral upper extremity supported Sitting balance-Leahy Scale: Poor Sitting balance - Comments: initially requires Min-ModA to maintain upright, faded to min guard  Postural control: Posterior lean Standing balance support: Bilateral upper extremity supported;During functional activity Standing balance-Leahy Scale: Poor Standing balance comment: MinA+2 for safety, reliance on B UE support                             Cognition Arousal/Alertness: Awake/alert Behavior During Therapy: Flat affect Overall Cognitive Status: Impaired/Different from baseline Area of Impairment: Orientation;Attention;Memory;Following commands;Safety/judgement;Awareness;Problem solving                 Orientation Level: Disoriented to;Place;Time;Situation Current Attention Level: Sustained Memory: Decreased recall of precautions;Decreased short-term memory Following Commands: Follows one  step commands inconsistently;Follows one step commands with increased time Safety/Judgement: Decreased awareness of safety;Decreased awareness of  deficits Awareness: Intellectual Problem Solving: Slow processing;Decreased initiation;Difficulty sequencing;Requires verbal cues;Requires tactile cues        Exercises      General Comments        Pertinent Vitals/Pain Pain Assessment: Faces Faces Pain Scale: Hurts even more Pain Location: R hip  Pain Descriptors / Indicators: Aching;Sore Pain Intervention(s): Limited activity within patient's tolerance;Monitored during session;Repositioned;Ice applied    Home Living                      Prior Function            PT Goals (current goals can now be found in the care plan section) Acute Rehab PT Goals PT Goal Formulation: Patient unable to participate in goal setting Progress towards PT goals: Progressing toward goals    Frequency    7X/week      PT Plan Current plan remains appropriate    Co-evaluation PT/OT/SLP Co-Evaluation/Treatment: Yes Reason for Co-Treatment: For patient/therapist safety;To address functional/ADL transfers PT goals addressed during session: Mobility/safety with mobility;Balance;Proper use of DME;Strengthening/ROM        AM-PAC PT "6 Clicks" Daily Activity  Outcome Measure  Difficulty turning over in bed (including adjusting bedclothes, sheets and blankets)?: Unable Difficulty moving from lying on back to sitting on the side of the bed? : Unable Difficulty sitting down on and standing up from a chair with arms (e.g., wheelchair, bedside commode, etc,.)?: Unable Help needed moving to and from a bed to chair (including a wheelchair)?: Total Help needed walking in hospital room?: Total Help needed climbing 3-5 steps with a railing? : Total 6 Click Score: 6    End of Session Equipment Utilized During Treatment: Oxygen Activity Tolerance: Patient tolerated treatment well Patient left: in chair;with call bell/phone within reach Nurse Communication: Mobility status;Need for lift equipment PT Visit Diagnosis: Unsteadiness on feet  (R26.81);Muscle weakness (generalized) (M62.81);Difficulty in walking, not elsewhere classified (R26.2)     Time: 7342-8768 PT Time Calculation (min) (ACUTE ONLY): 29 min  Charges:  $Therapeutic Activity: 8-22 mins                     Deniece Ree PT, DPT, CBIS  Supplemental Physical Therapist [place]    Pager [phone] Acute Rehab Office [phone]

## 2018-01-29 NOTE — Evaluation (Signed)
Physical Therapy Evaluation Patient Details Name: Chad Cameron MRN: [id number] DOB: [birth year] Today's Date: 01/29/2018   History of Present Illness  84yo male who received elective R anterior THA on 01/28/18. PMH anxiety, aortic aneurysm, COPD, dementia, DM, HTN, neuropathy, schizophrenia, cardiac cath, hx foot fracture surgery, lumbar laminectomy, hx vascular surgery   Clinical Impression   Patient received in bed, very lethargic and A&Ox1 this morning, but willing to attempt PT session.  Attempted to perform bed mobility with MaxAx1, able to partially get to EOB before patient began crying out in pain and physically resisted PT, refused to continue with mobility and required MaxA to return to bed. He was left in bed with all needs met, bed alarm activated. Session limited by cognition and pain, RN aware of patient status and PT plan to return later in afternoon for timing of pain medicine. He will continue to benefit from skilled PT services in the acute setting, however strongly recommend ST-SNF moving forward as patient is not safe or functional enough to return home alone at this time.     Follow Up Recommendations Follow surgeon's recommendation for DC plan and follow-up therapies;SNF    Equipment Recommendations  Other (comment)(defer to next venue )    Recommendations for Other Services       Precautions / Restrictions Precautions Precautions: Fall;Anterior Hip Precaution Booklet Issued: No Restrictions Weight Bearing Restrictions: Yes RLE Weight Bearing: Weight bearing as tolerated      Mobility  Bed Mobility Overal bed mobility: Needs Assistance Bed Mobility: Supine to Sit     Supine to sit: Max assist     General bed mobility comments: attempted bed mobility with assist of +1, able to perform partial transfer then patient began yelling out due to pain and physically resisting PT; MaxA to return back to bed   Transfers                 General transfer comment:  deferred   Ambulation/Gait             General Gait Details: deferred   Stairs            Wheelchair Mobility    Modified Rankin (Stroke Patients Only)       Balance                                             Pertinent Vitals/Pain Pain Assessment: Faces Faces Pain Scale: Hurts whole lot Pain Location: R hip  Pain Descriptors / Indicators: Aching;Sore Pain Intervention(s): Limited activity within patient's tolerance;Monitored during session;Patient requesting pain meds-RN notified;Repositioned    Home Living Family/patient expects to be discharged to:: Private residence Living Arrangements: Alone Available Help at Discharge: Friend(s) Type of Home: Apartment Home Access: Ramped entrance     Home Layout: One level Home Equipment: [place] - single point;Walker - standard;Shower seat Additional Comments: patient unable to provide history- all information taken from prior charting     Prior Function           Comments: unknown- patient uanble to provide history and no prior charting available to pull from      Hand Dominance        Extremity/Trunk Assessment   Upper Extremity Assessment Upper Extremity Assessment: Defer to OT evaluation    Lower Extremity Assessment Lower Extremity Assessment: Generalized weakness    Cervical / Trunk Assessment  Cervical / Trunk Assessment: Kyphotic  Communication   Communication: No difficulties  Cognition Arousal/Alertness: Lethargic Behavior During Therapy: Flat affect Overall Cognitive Status: Impaired/Different from baseline Area of Impairment: Orientation;Attention;Memory;Following commands;Safety/judgement;Awareness;Problem solving                 Orientation Level: Disoriented to;Place;Time;Situation Current Attention Level: Sustained Memory: Decreased recall of precautions;Decreased short-term memory Following Commands: Follows one step commands inconsistently;Follows one  step commands with increased time Safety/Judgement: Decreased awareness of safety;Decreased awareness of deficits Awareness: Intellectual Problem Solving: Slow processing;Decreased initiation;Difficulty sequencing;Requires verbal cues;Requires tactile cues        General Comments General comments (skin integrity, edema, etc.): unable to get to full sitting position at EOB, unable to assess balance at eval     Exercises     Assessment/Plan    PT Assessment Patient needs continued PT services  PT Problem List Decreased strength;Decreased cognition;Decreased knowledge of use of DME;Decreased activity tolerance;Decreased safety awareness;Decreased balance;Decreased knowledge of precautions;Pain;Decreased mobility;Decreased coordination       PT Treatment Interventions DME instruction;Balance training;Gait training;Neuromuscular re-education;Stair training;Functional mobility training;Patient/family education;Therapeutic activities;Therapeutic exercise;Manual techniques    PT Goals (Current goals can be found in the Care Plan section)  Acute Rehab PT Goals PT Goal Formulation: Patient unable to participate in goal setting    Frequency 7X/week   Barriers to discharge        Co-evaluation               AM-PAC PT "6 Clicks" Daily Activity  Outcome Measure Difficulty turning over in bed (including adjusting bedclothes, sheets and blankets)?: Unable Difficulty moving from lying on back to sitting on the side of the bed? : Unable Difficulty sitting down on and standing up from a chair with arms (e.g., wheelchair, bedside commode, etc,.)?: Unable Help needed moving to and from a bed to chair (including a wheelchair)?: Total Help needed walking in hospital room?: Total Help needed climbing 3-5 steps with a railing? : Total 6 Click Score: 6    End of Session Equipment Utilized During Treatment: Oxygen Activity Tolerance: Patient limited by pain;Patient limited by  lethargy Patient left: in bed;with call bell/phone within reach;with bed alarm set Nurse Communication: Mobility status;Patient requests pain meds PT Visit Diagnosis: Unsteadiness on feet (R26.81);Muscle weakness (generalized) (M62.81);Difficulty in walking, not elsewhere classified (R26.2)    Time: 6606-3016 PT Time Calculation (min) (ACUTE ONLY): 18 min   Charges:   PT Evaluation $PT Eval Moderate Complexity: 1 Mod          Deniece Ree PT, DPT, CBIS  Supplemental Physical Therapist [place]    Pager [phone] Acute Rehab Office [phone]

## 2018-01-29 NOTE — Evaluation (Signed)
Occupational Therapy Evaluation Patient Details Name: Chad Cameron MRN: [id number] DOB: April 12, 1931 Today's Date: 01/29/2018    History of Present Illness 84yo male who received elective R anterior THA on 01/28/18. PMH anxiety, aortic aneurysm, COPD, dementia, DM, HTN, neuropathy, schizophrenia, cardiac cath, hx foot fracture surgery, lumbar laminectomy, hx vascular surgery    Clinical Impression   PTA Pt reports mod I with SPC and shower chair- however at this time Pt unreliable historian. Pt is currently mod A +2 for transfers and LB ADL. Deficits in balance, activity tolerance, safety awareness, and cognition. Pt requires continued skilled OT in the acute setting as well as afterwards at the SNF level. RN staff instructed to use stedy with patient. OT will continue to follow to maximize safety and independence in ADL and functional transfers.     Follow Up Recommendations  SNF;Supervision/Assistance - 24 hour    Equipment Recommendations  Other (comment)(defer to next venue)    Recommendations for Other Services       Precautions / Restrictions Precautions Precautions: Fall Precaution Booklet Issued: No Precaution Comments: no precautions - direct anterior Restrictions Weight Bearing Restrictions: Yes RLE Weight Bearing: Weight bearing as tolerated      Mobility Bed Mobility Overal bed mobility: Needs Assistance Bed Mobility: Supine to Sit     Supine to sit: Max assist;+2 for physical assistance     General bed mobility comments: +2 for helicopter technique to pivot to edge of bed, patient pain limited and yelling out during transfer; initially required ModA to maintain upright due to posterior lean but faded to min guard   Transfers Overall transfer level: Needs assistance Equipment used: Standard walker Transfers: Sit to/from Stand;Stand Pivot Transfers Sit to Stand: Mod assist;+2 physical assistance Stand pivot transfers: Min assist;+2 physical assistance        General transfer comment: ModA+2 to come to full standing position at EOB, MinA+2 to assist in moving standard walker and for general balance and safety. Mod-max verbal cues provided during transfer.     Balance Overall balance assessment: Needs assistance Sitting-balance support: Feet supported;Bilateral upper extremity supported Sitting balance-Leahy Scale: Poor Sitting balance - Comments: initially requires Min-ModA to maintain upright, faded to min guard  Postural control: Posterior lean Standing balance support: Bilateral upper extremity supported;During functional activity Standing balance-Leahy Scale: Poor Standing balance comment: MinA+2 for safety, reliance on B UE support                            ADL either performed or assessed with clinical judgement   ADL Overall ADL's : Needs assistance/impaired Eating/Feeding: Set up;Sitting   Grooming: Minimal assistance;Sitting Grooming Details (indicate cue type and reason): cues for attention to task Upper Body Bathing: Moderate assistance   Lower Body Bathing: Maximal assistance   Upper Body Dressing : Minimal assistance;Sitting   Lower Body Dressing: Maximal assistance;+2 for safety/equipment;Sit to/from stand   Toilet Transfer: Moderate assistance;+2 for physical assistance;+2 for safety/equipment;Stand-pivot;BSC;RW Toilet Transfer Details (indicate cue type and reason): simulated with transfer to recliner Toileting- Clothing Manipulation and Hygiene: Total assistance       Functional mobility during ADLs: Moderate assistance;+2 for physical assistance;+2 for safety/equipment;Rolling walker General ADL Comments: decreased cognition and pain limits ability to recall compensatory strategies for ADL for LB     Vision         Perception     Praxis      Pertinent Vitals/Pain Pain Assessment: Faces Faces Pain Scale: Hurts  even more Pain Location: R hip  Pain Descriptors / Indicators: Aching;Sore Pain  Intervention(s): Monitored during session;Repositioned;Ice applied     Hand Dominance     Extremity/Trunk Assessment Upper Extremity Assessment Upper Extremity Assessment: Overall WFL for tasks assessed   Lower Extremity Assessment Lower Extremity Assessment: Defer to PT evaluation   Cervical / Trunk Assessment Cervical / Trunk Assessment: Kyphotic   Communication Communication Communication: No difficulties   Cognition Arousal/Alertness: Awake/alert Behavior During Therapy: Flat affect Overall Cognitive Status: Impaired/Different from baseline Area of Impairment: Orientation;Attention;Memory;Following commands;Safety/judgement;Awareness;Problem solving                 Orientation Level: Disoriented to;Place;Time;Situation Current Attention Level: Sustained Memory: Decreased recall of precautions;Decreased short-term memory Following Commands: Follows one step commands inconsistently;Follows one step commands with increased time Safety/Judgement: Decreased awareness of safety;Decreased awareness of deficits Awareness: Intellectual Problem Solving: Slow processing;Decreased initiation;Difficulty sequencing;Requires verbal cues;Requires tactile cues     General Comments       Exercises     Shoulder Instructions      Home Living Family/patient expects to be discharged to:: Private residence Living Arrangements: Other (Comment);Spouse/significant other(significant other?!?!? ) Available Help at Discharge: Friend(s) Type of Home: Apartment Home Access: Ramped entrance     Home Layout: One level     Bathroom Shower/Tub: Chief Strategy Officer: Standard     Home Equipment: Cane - single point;Walker - standard;Shower seat   Additional Comments: Pt providing inconsistent history. Unreliable historian      Prior Functioning/Environment Level of Independence: Needs assistance  Gait / Transfers Assistance Needed: SPC in room ADL's / Homemaking  Assistance Needed: at least used a shower chair - unsure of true levels   Comments: unknown- patient uanble to provide history and no prior charting available to pull from         OT Problem List: Decreased strength;Decreased range of motion;Decreased activity tolerance;Impaired balance (sitting and/or standing);Decreased cognition;Decreased safety awareness;Decreased knowledge of use of DME or AE;Decreased knowledge of precautions;Pain      OT Treatment/Interventions: Self-care/ADL training;DME and/or AE instruction;Therapeutic activities;Patient/family education;Balance training    OT Goals(Current goals can be found in the care plan section) Acute Rehab OT Goals Patient Stated Goal: "walk again" OT Goal Formulation: With patient Time For Goal Achievement: 02/12/18 Potential to Achieve Goals: Good ADL Goals Pt Will Perform Lower Body Bathing: with supervision;with adaptive equipment;sitting/lateral leans Pt Will Perform Lower Body Dressing: with mod assist;sit to/from stand Pt Will Transfer to Toilet: with min assist;stand pivot transfer;bedside commode Pt Will Perform Toileting - Clothing Manipulation and hygiene: with supervision;sitting/lateral leans Additional ADL Goal #1: Pt will perform bed mobility at min A level prior to engaging in ADL activity  OT Frequency: Min 2X/week   Barriers to D/C: Decreased caregiver support  unsure of living situation - lives alone?       Co-evaluation PT/OT/SLP Co-Evaluation/Treatment: Yes Reason for Co-Treatment: For patient/therapist safety;To address functional/ADL transfers;Necessary to address cognition/behavior during functional activity PT goals addressed during session: Mobility/safety with mobility;Balance;Proper use of DME;Strengthening/ROM OT goals addressed during session: ADL's and self-care;Proper use of Adaptive equipment and DME;Strengthening/ROM      AM-PAC PT "6 Clicks" Daily Activity     Outcome Measure Help from another  person eating meals?: None Help from another person taking care of personal grooming?: A Little Help from another person toileting, which includes using toliet, bedpan, or urinal?: A Lot Help from another person bathing (including washing, rinsing, drying)?: A Lot Help from another  person to put on and taking off regular upper body clothing?: A Little Help from another person to put on and taking off regular lower body clothing?: A Lot 6 Click Score: 16   End of Session Equipment Utilized During Treatment: Gait belt;Rolling walker;Oxygen Nurse Communication: Mobility status;Need for lift equipment;Weight bearing status(stedy)  Activity Tolerance: Patient tolerated treatment well Patient left: in chair;with call bell/phone within reach  OT Visit Diagnosis: Unsteadiness on feet (R26.81);Other abnormalities of gait and mobility (R26.89);History of falling (Z91.81);Other symptoms and signs involving cognitive function;Pain Pain - Right/Left: Right Pain - part of body: Hip                Time: 1530-1559 OT Time Calculation (min): 29 min Charges:  OT General Charges $OT Visit: 1 Visit OT Evaluation $OT Eval Moderate Complexity: 1 Mod  Sherryl Manges OTR/L Acute Rehabilitation Services Pager: [phone] Office: [phone]  Evern Bio Raymondo Garcialopez 01/29/2018, 5:14 PM

## 2018-01-29 NOTE — Plan of Care (Signed)
Problem: Education: Goal: Knowledge of General Education information will improve Description Including pain rating scale, medication(s)/side effects and non-pharmacologic comfort measures Outcome: Progressing   Problem: Health Behavior/Discharge Planning: Goal: Ability to manage health-related needs will improve Outcome: Progressing   Problem: Clinical Measurements: Goal: Respiratory complications will improve Outcome: Progressing   Problem: Coping: Goal: Level of anxiety will decrease Outcome: Progressing   Problem: Elimination: Goal: Will not experience complications related to urinary retention Outcome: Progressing   Problem: Pain Managment: Goal: General experience of comfort will improve Outcome: Progressing   Problem: Safety: Goal: Ability to remain free from injury will improve Outcome: Progressing   Problem: Skin Integrity: Goal: Risk for impaired skin integrity will decrease Outcome: Progressing   

## 2018-01-29 NOTE — Progress Notes (Signed)
Inpatient Diabetes Program Recommendations  AACE/ADA: New Consensus Statement on Inpatient Glycemic Control (2015)  Target Ranges:  Prepandial:   less than 140 mg/dL      Peak postprandial:   less than 180 mg/dL (1-2 hours)      Critically ill patients:  140 - 180 mg/dL   Results for Rivest, Taggart "DON" (MRN [id number]) as of 01/29/2018 09:11  Ref. Range 01/28/2018 12:03 01/28/2018 16:08 01/28/2018 18:13 01/28/2018 22:04  Glucose-Capillary Latest Ref Range: 70 - 99 mg/dL 301 (H) 97 601 (H) 093 (H)    Admit with: Total R Hip  History: DM, Dementia  Home DM Meds:  Lantus 50 units QHS  Current Orders: Lantus 50 units QHS     MD- Please consider placing orders for Novolog Sensitive Correction Scale/ SSI (0-9 units) TID AC + HS      --Will follow patient during hospitalization--  Ambrose Finland RN, MSN, CDE Diabetes Coordinator Inpatient Glycemic Control Team Team Pager: [phone] (8a-5p)

## 2018-01-29 NOTE — Progress Notes (Signed)
Patient ID: Chad Cameron, male   DOB: 09/27/1931, 84 y.o.   MRN: [id number] Tolerated surgery well.  Right hip stable.  Exhibiting some dementia signs this am.  Does follow commands well.  Would limit pain meds and can get up with therapy with [facility].  Will need Social Work consult for skilled nursing placement likely.

## 2018-01-30 DIAGNOSIS — K219 Gastro-esophageal reflux disease without esophagitis: Secondary | ICD-10-CM

## 2018-01-30 DIAGNOSIS — J189 Pneumonia, unspecified organism: Secondary | ICD-10-CM

## 2018-01-30 HISTORY — DX: Gastro-esophageal reflux disease without esophagitis: K21.9

## 2018-01-30 HISTORY — DX: Pneumonia, unspecified organism: J18.9

## 2018-01-30 LAB — CBC
HCT: 30.3 % — ABNORMAL LOW (ref 39.0–52.0)
Hemoglobin: 9.3 g/dL — ABNORMAL LOW (ref 13.0–17.0)
MCH: 26.7 pg (ref 26.0–34.0)
MCHC: 30.7 g/dL (ref 30.0–36.0)
MCV: 87.1 fL (ref 80.0–100.0)
Platelets: 227 10*3/uL (ref 150–400)
RBC: 3.48 MIL/uL — ABNORMAL LOW (ref 4.22–5.81)
RDW: 12.9 % (ref 11.5–15.5)
WBC: 9.9 10*3/uL (ref 4.0–10.5)
nRBC: 0 % (ref 0.0–0.2)

## 2018-01-30 LAB — GLUCOSE, CAPILLARY
Glucose-Capillary: 66 mg/dL — ABNORMAL LOW (ref 70–99)
Glucose-Capillary: 113 mg/dL — ABNORMAL HIGH (ref 70–99)
Glucose-Capillary: 180 mg/dL — ABNORMAL HIGH (ref 70–99)
Glucose-Capillary: 95 mg/dL (ref 70–99)

## 2018-01-30 MED ORDER — HYDROCODONE-ACETAMINOPHEN 5-325 MG PO TABS: 1.0000 | ORAL_TABLET | Freq: Four times a day (QID) | ORAL | 0 refills | Status: DC | PRN

## 2018-01-30 NOTE — NC FL2 (Signed)
[place] MEDICAID FL2 LEVEL OF CARE SCREENING TOOL     IDENTIFICATION  Patient Name: Chad Cameron Birthdate: [birth year] Sex: male Admission Date (Current Location): 01/28/2018  [facility] and [place] Number:  [employer] and Address:  [facility], [address] [place], [place] [address]      Provider Number: [id number]  Attending Physician Name and Address:  Kathryne Hitch  Relative Name and Phone Number:  Renea Ee [phone]    Current Level of Care: Hospital Recommended Level of Care: Skilled Nursing Facility Prior Approval Number:    Date Approved/Denied:   PASRR Number: [id number]  Discharge Plan: SNF    Current Diagnoses: Patient Active Problem List   Diagnosis Date Noted  . Status post total replacement of right hip 01/28/2018  . Unilateral primary osteoarthritis, right hip 01/01/2018  . S/P lumbar laminectomy 11/02/2016    Orientation RESPIRATION BLADDER Height & Weight     Self, Time, Situation, Place  O2(nasal cannula 2L/min) Continent Weight: 192 lb (87.1 kg) Height:  6' (182.9 cm)  BEHAVIORAL SYMPTOMS/MOOD NEUROLOGICAL BOWEL NUTRITION STATUS      Continent Diet(see discharge summary)  AMBULATORY STATUS COMMUNICATION OF NEEDS Skin   Extensive Assist Verbally Surgical wounds(closed surgical incision right hip and back)                       Personal Care Assistance Level of Assistance  Bathing, Feeding, Dressing, Total care Bathing Assistance: Maximum assistance Feeding assistance: Independent Dressing Assistance: Maximum assistance Total Care Assistance: Maximum assistance   Functional Limitations Info  Sight, Hearing, Speech Sight Info: Adequate Hearing Info: Impaired Speech Info: Adequate    SPECIAL CARE FACTORS FREQUENCY  PT (By licensed PT), OT (By licensed OT)     PT Frequency: min 7x weekly OT Frequency: min 2x weekly            Contractures Contractures Info: Not present     Additional Factors Info  Code Status, Allergies Code Status Info: full Allergies Info: Allergies:  No Known Allergies           Current Medications (01/30/2018):  This is the current hospital active medication list Current Facility-Administered Medications  Medication Dose Route Frequency Provider Last Rate Last Dose  . 0.9 %  sodium chloride infusion   Intravenous Continuous Kathryne Hitch, MD 10 mL/hr at 01/30/18 [id number]    . acetaminophen (TYLENOL) tablet 325-650 mg  325-650 mg Oral Q6H PRN Kathryne Hitch, MD      . albuterol (PROVENTIL) (2.5 MG/3ML) 0.083% nebulizer solution 2.5 mg  2.5 mg Inhalation TID Kathryne Hitch, MD   2.5 mg at 01/30/18 1422  . amLODipine (NORVASC) tablet 5 mg  5 mg Oral Daily Kathryne Hitch, MD   5 mg at 01/30/18 1009  . aspirin EC tablet 81 mg  81 mg Oral Daily Kathryne Hitch, MD   81 mg at 01/30/18 1009  . atorvastatin (LIPITOR) tablet 80 mg  80 mg Oral QHS Kathryne Hitch, MD   80 mg at 01/29/18 2040  . cholecalciferol (VITAMIN D) tablet 2,000 Units  2,000 Units Oral Daily Kathryne Hitch, MD   2,000 Units at 01/30/18 1009  . clopidogrel (PLAVIX) tablet 75 mg  75 mg Oral Daily Kathryne Hitch, MD   75 mg at 01/30/18 1008  . diphenhydrAMINE (BENADRYL) 12.5 MG/5ML elixir 12.5-25 mg  12.5-25 mg Oral Q4H PRN Kathryne Hitch, MD      .  docusate sodium (COLACE) capsule 100 mg  100 mg Oral BID Kathryne Hitch, MD   100 mg at 01/30/18 1009  . donepezil (ARICEPT) tablet 5 mg  5 mg Oral QHS Kathryne Hitch, MD   5 mg at 01/29/18 2040  . DULoxetine (CYMBALTA) DR capsule 60 mg  60 mg Oral Daily Kathryne Hitch, MD   60 mg at 01/30/18 1009  . finasteride (PROSCAR) tablet 5 mg  5 mg Oral QHS Kathryne Hitch, MD   5 mg at 01/29/18 2038  . gabapentin (NEURONTIN) capsule 600 mg  600 mg Oral TID Kathryne Hitch, MD   600 mg at 01/30/18 1009  .  HYDROcodone-acetaminophen (NORCO/VICODIN) 5-325 MG per tablet 1-2 tablet  1-2 tablet Oral Q4H PRN Kathryne Hitch, MD   1 tablet at 01/30/18 1453  . insulin aspart (novoLOG) injection 0-15 Units  0-15 Units Subcutaneous TID WC Kirtland Bouchard, PA-C   3 Units at 01/29/18 1658  . insulin aspart (novoLOG) injection 0-5 Units  0-5 Units Subcutaneous QHS Richardean Canal W, PA-C      . insulin glargine (LANTUS) injection 50 Units  50 Units Subcutaneous QHS Kathryne Hitch, MD   50 Units at 01/29/18 2215  . menthol-cetylpyridinium (CEPACOL) lozenge 3 mg  1 lozenge Oral PRN Kathryne Hitch, MD       Or  . phenol (CHLORASEPTIC) mouth spray 1 spray  1 spray Mouth/Throat PRN Kathryne Hitch, MD      . methocarbamol (ROBAXIN) tablet 500 mg  500 mg Oral Q6H PRN Kathryne Hitch, MD   500 mg at 01/30/18 1014  . metoCLOPramide (REGLAN) tablet 5-10 mg  5-10 mg Oral Q8H PRN Kathryne Hitch, MD       Or  . metoCLOPramide (REGLAN) injection 5-10 mg  5-10 mg Intravenous Q8H PRN Kathryne Hitch, MD      . ondansetron [facility]) tablet 4 mg  4 mg Oral Q6H PRN Kathryne Hitch, MD       Or  . ondansetron [facility]) injection 4 mg  4 mg Intravenous Q6H PRN Kathryne Hitch, MD      . pantoprazole (PROTONIX) EC tablet 40 mg  40 mg Oral Daily Kathryne Hitch, MD   40 mg at 01/30/18 1008  . polyethylene glycol (MIRALAX / GLYCOLAX) packet 17 g  17 g Oral Daily PRN Kathryne Hitch, MD      . pregabalin (LYRICA) capsule 150 mg  150 mg Oral BID Kathryne Hitch, MD   150 mg at 01/30/18 1009     Discharge Medications: Please see discharge summary for a list of discharge medications.  Relevant Imaging Results:  Relevant Lab Results:   Additional Information SSN: [id number]  Gildardo Griffes, LCSW

## 2018-01-30 NOTE — Discharge Instructions (Signed)

## 2018-01-30 NOTE — Care Management Note (Signed)
Case Management Note  Patient Details  Name: Chad Cameron MRN: [id number] Date of Birth: 22-Oct-1931  Subjective/Objective:                    Action/Plan:  Spoke with patient at bedside. He states that he lives at home alone most of the week. He has a friend Renea Ee who lives at [facility] in [place] (ALF?) who comes over Thursday evening and stays until Monday evening each week. Discussed difference btwn HH PT and SNF as rec PT eval. Patient is agreeable to SNF, however he states he only wants to stay 10 days. Notified CSW Morrie Sheldon to further discuss options with him.   Expected Discharge Date:                  Expected Discharge Plan:  Skilled Nursing Facility  In-House Referral:  Clinical Social Work  Discharge planning Services  CM Consult  Post Acute Care Choice:    Choice offered to:     DME Arranged:    DME Agency:     HH Arranged:    HH Agency:     Status of Service:  In process, will continue to follow  If discussed at Long Length of Stay Meetings, dates discussed:    Additional Comments:  Lawerance Sabal, RN 01/30/2018, 11:33 AM

## 2018-01-30 NOTE — Clinical Social Work Note (Signed)
Clinical Social Work Assessment  Patient Details  Name: Chad Cameron MRN: [id number] Date of Birth: October 17, 1931  Date of referral:  01/30/18               Reason for consult:  Discharge Planning                Permission sought to share information with:  Case Manager, Facility Medical sales representative Permission granted to share information::     Name::     none identified right now  Agency::  SNFs  Relationship::     Contact Information:     Housing/Transportation Living arrangements for the past 2 months:  Single Family Home Source of Information:  Patient Patient Interpreter Needed:  None Criminal Activity/Legal Involvement Pertinent to Current Situation/Hospitalization:  No - Comment as needed Significant Relationships:  Significant Other Lives with:  Significant Other Do you feel safe going back to the place where you live?  No Need for family participation in patient care:  No (Coment)  Care giving concerns:  CSW received referral for possible SNF placement at time of discharge. Spoke with patient regarding possibility of SNF placement . Patient  expressed understanding of PT recommendation and are agreeable to SNF placement at time of discharge. CSW to continue to follow and assist with discharge planning needs.     Social Worker assessment / plan:  Spoke with patient concerning possibility of rehab at SNF before returning home.    Employment status:  Retired Database administrator PT Recommendations:  Skilled Nursing Facility Information / Referral to community resources:  Skilled Nursing Facility  Patient/Family's Response to care:  Patient  recognizes need for rehab before returning home and are agreeable to a SNF in [place]. They report preference for [facility]  . CSW explained insurance authorization process.    Patient/Family's Understanding of and Emotional Response to Diagnosis, Current Treatment, and Prognosis:  Patient/family is  realistic regarding therapy needs and expressed being hopeful for SNF placement at [facility]. Patient expressed understanding of CSW role and discharge process as well as medical condition. No questions/concerns about plan or treatment.    Emotional Assessment Appearance:  Appears stated age Attitude/Demeanor/Rapport:  Apprehensive, Self-Confident Affect (typically observed):  Apprehensive Orientation:  Oriented to Self, Oriented to Place, Oriented to  Time, Oriented to Situation Alcohol / Substance use:  Not Applicable Psych involvement (Current and /or in the community):  No (Comment)  Discharge Needs  Concerns to be addressed:  Discharge Planning Concerns Readmission within the last 30 days:  No Current discharge risk:  Dependent with Mobility Barriers to Discharge:  Continued Medical Work up   [employer], LCSW 01/30/2018, 4:15 PM

## 2018-01-30 NOTE — Progress Notes (Signed)
CSW sent referral to [facility] in [place] in which patient was accepted.   CSW called Hannah Beat to notify of patient selection and for SNF to being insurance authorization with [facility]. CSW was unable to reach a member of the facility and lvm with admissions to please start insurance authorization.   [place], [place] [phone]

## 2018-01-30 NOTE — Progress Notes (Signed)
Physical Therapy Treatment Patient Details Name: Chad Cameron MRN: [id number] DOB: Dec 07, 1931 Today's Date: 01/30/2018    History of Present Illness 84yo male who received elective R anterior THA on 01/28/18. PMH anxiety, aortic aneurysm, COPD, dementia, DM, HTN, neuropathy, schizophrenia, cardiac cath, hx foot fracture surgery, lumbar laminectomy, hx vascular surgery     PT Comments    Patient received up in chair, pleasant and willing to attempt PT session but appears more lethargic this afternoon. Performed toe and heel raises, long arc quads, seated hip ABD/ADD with extended time for all exercises; attempted seated marches but patient then politely declined further activity, stating "I've had enough, I'm done". He frequently closes his eyes, opens them and responds appropriately to verbal stimulation with extended time, stating "I close my eyes a lot". He was left up in the chair with alarm active and all needs met, RN aware of patient status.     Follow Up Recommendations  Follow surgeon's recommendation for DC plan and follow-up therapies;SNF     Equipment Recommendations  Other (comment)(defer to next  venue )    Recommendations for Other Services Speech consult     Precautions / Restrictions Precautions Precautions: Fall Precaution Booklet Issued: No Precaution Comments: no precautions - direct anterior Restrictions Weight Bearing Restrictions: Yes RLE Weight Bearing: Weight bearing as tolerated    Mobility  Bed Mobility     General bed mobility comments: OOB in chair   Transfers       General transfer comment: deferred   Ambulation/Gait     General Gait Details: deferred    Stairs             Wheelchair Mobility    Modified Rankin (Stroke Patients Only)       Balance Overall balance assessment: Independent Sitting-balance support: Feet supported;Bilateral upper extremity supported Sitting balance-Leahy Scale: Fair Sitting balance - Comments:  able to maintain with min guard  Postural control: Posterior lean Standing balance support: Bilateral upper extremity supported;During functional activity Standing balance-Leahy Scale: Poor Standing balance comment: MinA for safety, ongoing heavy reliance on B UE support                             Cognition Arousal/Alertness: Awake/alert Behavior During Therapy: Flat affect Overall Cognitive Status: Impaired/Different from baseline Area of Impairment: Orientation;Attention;Memory;Following commands;Safety/judgement;Awareness;Problem solving                 Orientation Level: Disoriented to;Place;Time Current Attention Level: Sustained Memory: Decreased recall of precautions;Decreased short-term memory Following Commands: Follows one step commands inconsistently;Follows one step commands with increased time Safety/Judgement: Decreased awareness of safety;Decreased awareness of deficits Awareness: Intellectual Problem Solving: Slow processing;Decreased initiation;Difficulty sequencing;Requires verbal cues;Requires tactile cues        Exercises Total Joint Exercises Ankle Circles/Pumps: Both;20 reps;Seated Hip ABduction/ADduction: Both;10 reps;Seated Long Arc Quad: Both;10 reps;Seated    General Comments        Pertinent Vitals/Pain Pain Assessment: 0-10 Pain Score: 6  Pain Location: R hip  Pain Descriptors / Indicators: Aching;Sore;Guarding Pain Intervention(s): Limited activity within patient's tolerance;Monitored during session    Home Living                      Prior Function            PT Goals (current goals can now be found in the care plan section) Acute Rehab PT Goals Patient Stated Goal: "walk again" PT Goal Formulation:  Patient unable to participate in goal setting Progress towards PT goals: Progressing toward goals    Frequency    7X/week      PT Plan Current plan remains appropriate    Co-evaluation               AM-PAC PT "6 Clicks" Daily Activity  Outcome Measure  Difficulty turning over in bed (including adjusting bedclothes, sheets and blankets)?: Unable Difficulty moving from lying on back to sitting on the side of the bed? : Unable Difficulty sitting down on and standing up from a chair with arms (e.g., wheelchair, bedside commode, etc,.)?: A Lot Help needed moving to and from a bed to chair (including a wheelchair)?: A Lot Help needed walking in hospital room?: A Lot Help needed climbing 3-5 steps with a railing? : Total 6 Click Score: 9    End of Session Equipment Utilized During Treatment: Gait belt Activity Tolerance: Patient limited by pain;Patient limited by lethargy Patient left: in chair;with call bell/phone within reach;with chair alarm set;with family/visitor present Nurse Communication: Mobility status;Need for lift equipment;Other (comment)(SpO2 on room air ) PT Visit Diagnosis: Unsteadiness on feet (R26.81);Muscle weakness (generalized) (M62.81);Difficulty in walking, not elsewhere classified (R26.2)     Time: 8676-7209 PT Time Calculation (min) (ACUTE ONLY): 15 min  Charges:  $Therapeutic Exercise: 8-22 mins   Deniece Ree PT, DPT, CBIS  Supplemental Physical Therapist [place]    Pager [phone] Acute Rehab Office [phone]

## 2018-01-30 NOTE — Progress Notes (Signed)
CSW provided patient with list of Skilled Nursing Facility to choose from. CSW will check in with patient at end of day to discuss his top choices.   Patient reports he does not want to go to SNF but would be agreeable to 10 days.   [place], [place] [phone]

## 2018-01-30 NOTE — Progress Notes (Signed)
Physical Therapy Treatment Patient Details Name: Chad Cameron MRN: [id number] DOB: [birth year] Today's Date: 01/30/2018    History of Present Illness 84yo male who received elective R anterior THA on 01/28/18. PMH anxiety, aortic aneurysm, COPD, dementia, DM, HTN, neuropathy, schizophrenia, cardiac cath, hx foot fracture surgery, lumbar laminectomy, hx vascular surgery     PT Comments    Patient received in bed, pleasant but with ongoing cognitive deficits including impaired orientation and very slow processing time requiring VC and TC for safe mobility. He continues to require MaxAX2 for bed mobility, but was able to maintain upright sitting with min guard at EOB. Performed sit to stand with MinAx2 and standard walker, then able to gait train 50f with standard walker and MinA/mod cues for sequencing, safety, and walker management. Close chair follow utilized during gait. He was left up in the chair with all needs met, chair alarm active, and RN aware of patient status. Plan to return for total hip exercise routine later this afternoon.     Follow Up Recommendations  Follow surgeon's recommendation for DC plan and follow-up therapies;SNF     Equipment Recommendations  Other (comment)(defer to next venue )    Recommendations for Other Services Speech consult     Precautions / Restrictions Precautions Precautions: Fall Precaution Booklet Issued: No Precaution Comments: no precautions - direct anterior Restrictions Weight Bearing Restrictions: Yes RLE Weight Bearing: Weight bearing as tolerated    Mobility  Bed Mobility Overal bed mobility: Needs Assistance Bed Mobility: Supine to Sit     Supine to sit: Max assist;+2 for physical assistance     General bed mobility comments: +2 for helicopter technique as patient with great difficulty performing bed mobility, able to hold upright at EOB immediately with Min guard   Transfers Overall transfer level: Needs assistance Equipment  used: Standard walker Transfers: Sit to/from Stand Sit to Stand: Min assist;+2 physical assistance         General transfer comment: MinA+2 to come to full standing today, cues for safety and sequencing   Ambulation/Gait Ambulation/Gait assistance: Min assist Gait Distance (Feet): 6 Feet Assistive device: Standard walker Gait Pattern/deviations: Decreased step length - right;Decreased step length - left;Step-to pattern;Decreased stride length;Decreased dorsiflexion - left;Decreased dorsiflexion - right;Decreased weight shift to right;Antalgic     General Gait Details: able to take short steps with walker and Mod cues for seqeuncing, limited by pain and cramping in R LE; close chair follow and MinA for RW management    Stairs             Wheelchair Mobility    Modified Rankin (Stroke Patients Only)       Balance Overall balance assessment: Needs assistance Sitting-balance support: Feet supported;Bilateral upper extremity supported Sitting balance-Leahy Scale: Fair Sitting balance - Comments: able to maintain with min guard  Postural control: Posterior lean Standing balance support: Bilateral upper extremity supported;During functional activity Standing balance-Leahy Scale: Poor Standing balance comment: MinA for safety, ongoing heavy reliance on B UE support                             Cognition Arousal/Alertness: Awake/alert Behavior During Therapy: Flat affect Overall Cognitive Status: Impaired/Different from baseline Area of Impairment: Orientation;Attention;Memory;Following commands;Safety/judgement;Awareness;Problem solving                 Orientation Level: Disoriented to;Place;Time Current Attention Level: Sustained Memory: Decreased recall of precautions;Decreased short-term memory Following Commands: Follows one step commands inconsistently;Follows  one step commands with increased time Safety/Judgement: Decreased awareness of  safety;Decreased awareness of deficits Awareness: Intellectual Problem Solving: Slow processing;Decreased initiation;Difficulty sequencing;Requires verbal cues;Requires tactile cues        Exercises      General Comments        Pertinent Vitals/Pain Pain Assessment: 0-10 Pain Score: 8  Pain Location: R hip  Pain Descriptors / Indicators: Aching;Sore Pain Intervention(s): Limited activity within patient's tolerance;Monitored during session;Premedicated before session    Home Living                      Prior Function            PT Goals (current goals can now be found in the care plan section) Acute Rehab PT Goals Patient Stated Goal: "walk again" PT Goal Formulation: Patient unable to participate in goal setting Progress towards PT goals: Progressing toward goals    Frequency    7X/week      PT Plan Current plan remains appropriate    Co-evaluation              AM-PAC PT "6 Clicks" Daily Activity  Outcome Measure  Difficulty turning over in bed (including adjusting bedclothes, sheets and blankets)?: Unable Difficulty moving from lying on back to sitting on the side of the bed? : Unable Difficulty sitting down on and standing up from a chair with arms (e.g., wheelchair, bedside commode, etc,.)?: A Lot Help needed moving to and from a bed to chair (including a wheelchair)?: A Lot Help needed walking in hospital room?: A Lot Help needed climbing 3-5 steps with a railing? : Total 6 Click Score: 9    End of Session Equipment Utilized During Treatment: Gait belt Activity Tolerance: Patient limited by pain Patient left: in chair;with call bell/phone within reach;with chair alarm set;with family/visitor present Nurse Communication: Mobility status;Need for lift equipment;Other (comment)(SpO2 on room air ) PT Visit Diagnosis: Unsteadiness on feet (R26.81);Muscle weakness (generalized) (M62.81);Difficulty in walking, not elsewhere classified (R26.2)      Time: 3903-0092 PT Time Calculation (min) (ACUTE ONLY): 19 min  Charges:  $Therapeutic Activity: 8-22 mins                     Deniece Ree PT, DPT, CBIS  Supplemental Physical Therapist [place]    Pager [phone] Acute Rehab Office [phone]

## 2018-01-30 NOTE — Progress Notes (Signed)
Subjective: 2 Days Post-Op Procedure(s) (LRB): RIGHT TOTAL HIP ARTHROPLASTY ANTERIOR APPROACH (Right) Patient reports pain as moderate.  He is doing better today in terms of his mental status.  A friend is at the bedside.  They understand the recommendation for short-term skilled nursing placement.  Objective: Vital signs in last 24 hours: Temp:  [97.9 F (36.6 C)-99.6 F (37.6 C)] 99.6 F (37.6 C) (11/07 0950) Pulse Rate:  [88-98] 95 (11/07 0950) Resp:  [14-20] 18 (11/07 0950) BP: (131-151)/(63-70) 131/64 (11/07 0950) SpO2:  [94 %-100 %] 97 % (11/07 0950)  Intake/Output from previous day: 11/06 0701 - 11/07 0700 In: 1199 [P.O.:380; I.V.:819] Out: 1225 [Urine:1225] Intake/Output this shift: Total I/O In: 120 [P.O.:120] Out: -   Recent Labs    01/29/18 0251 01/30/18 0224  HGB 10.0* 9.3*   Recent Labs    01/29/18 0251 01/30/18 0224  WBC 9.4 9.9  RBC 3.69* 3.48*  HCT 32.7* 30.3*  PLT 270 227   Recent Labs    01/29/18 0251  NA 138  K 5.2*  CL 101  CO2 32  BUN 9  CREATININE 1.04  GLUCOSE 181*  CALCIUM 8.5*   No results for input(s): LABPT, INR in the last 72 hours.  Sensation intact distally Intact pulses distally Dorsiflexion/Plantar flexion intact Incision: scant drainage  Assessment/Plan: 2 Days Post-Op Procedure(s) (LRB): RIGHT TOTAL HIP ARTHROPLASTY ANTERIOR APPROACH (Right) Up with therapy Plan for discharge tomorrow Discharge to SNF    Kathryne Hitch 01/30/2018, 11:31 AM

## 2018-01-31 ENCOUNTER — Encounter (HOSPITAL_COMMUNITY): Payer: Self-pay

## 2018-01-31 LAB — GLUCOSE, CAPILLARY
Glucose-Capillary: 99 mg/dL (ref 70–99)
Glucose-Capillary: 135 mg/dL — ABNORMAL HIGH (ref 70–99)
Glucose-Capillary: 166 mg/dL — ABNORMAL HIGH (ref 70–99)
Glucose-Capillary: 144 mg/dL — ABNORMAL HIGH (ref 70–99)

## 2018-01-31 MED ORDER — ALBUTEROL SULFATE (2.5 MG/3ML) 0.083% IN NEBU: 2.5000 mg | INHALATION_SOLUTION | Freq: Four times a day (QID) | RESPIRATORY_TRACT | Status: DC | PRN

## 2018-01-31 NOTE — Progress Notes (Signed)
Patient ID: Chad Cameron, male   DOB: [birth year], 84 y.o.   MRN: [id number] Stable overall.  Can go to skilled nursing today.

## 2018-01-31 NOTE — Progress Notes (Signed)
Physical Therapy Treatment Patient Details Name: Chad Cameron MRN: [id number] DOB: [birth year] Today's Date: 01/31/2018    History of Present Illness 84yo male who received elective R direct anterior THA on 01/28/18. PMH anxiety, aortic aneurysm, COPD, dementia, DM, HTN, neuropathy, schizophrenia, cardiac cath, hx foot fracture surgery, lumbar laminectomy, hx vascular surgery     PT Comments    Patient seen for mobilit progression. Pt is making gradual progress toward PT goals and tolerated increased gait distance this session.  Continue to progress as tolerated.   Follow Up Recommendations  Follow surgeon's recommendation for DC plan and follow-up therapies;SNF     Equipment Recommendations  Other (comment)(defer to next  venue )    Recommendations for Other Services Speech consult     Precautions / Restrictions Precautions Precautions: Fall Precaution Comments: no precautions - direct anterior Restrictions Weight Bearing Restrictions: Yes RLE Weight Bearing: Weight bearing as tolerated    Mobility  Bed Mobility Overal bed mobility: Needs Assistance Bed Mobility: Supine to Sit     Supine to sit: Max assist;+2 for physical assistance;HOB elevated     General bed mobility comments: cues for sequencing and technique as well as use of rail; pt unable to reach across to rail with L UE due to c/o shoulder pain; assistance to bring R LE/hips to EOB and to elevate trunk into sitting  Transfers Overall transfer level: Needs assistance Equipment used: Rolling walker (2 wheeled)(standard walker in room-got RW from ortho gym) Transfers: Sit to/from Stand Sit to Stand: Min assist;+2 physical assistance;From elevated surface         General transfer comment: cues for safe hand placement; assist to power up into standing   Ambulation/Gait Ambulation/Gait assistance: Min assist;+2 safety/equipment(chair follow) Gait Distance (Feet): 20 Feet Assistive device: Rolling walker (2  wheeled) Gait Pattern/deviations: Decreased step length - left;Decreased dorsiflexion - right;Decreased weight shift to right;Antalgic;Step-through pattern;Decreased stance time - right Gait velocity: decreased   General Gait Details: cues for posture, proximity to RW, and sequencing    Stairs             Wheelchair Mobility    Modified Rankin (Stroke Patients Only)       Balance Overall balance assessment: Needs assistance Sitting-balance support: Feet supported;Bilateral upper extremity supported Sitting balance-Leahy Scale: Poor Sitting balance - Comments: posterior lean initially   Standing balance support: Bilateral upper extremity supported;During functional activity Standing balance-Leahy Scale: Poor                              Cognition Arousal/Alertness: Awake/alert Behavior During Therapy: Flat affect Overall Cognitive Status: No family/caregiver present to determine baseline cognitive functioning Area of Impairment: Problem solving                             Problem Solving: Slow processing;Requires verbal cues General Comments: HOH      Exercises      General Comments General comments (skin integrity, edema, etc.): SpO2 WNL on RA during session      Pertinent Vitals/Pain Pain Assessment: Faces Faces Pain Scale: Hurts little more Pain Location: R hip  Pain Descriptors / Indicators: Aching;Sore;Guarding Pain Intervention(s): Limited activity within patient's tolerance;Monitored during session;Repositioned;Patient requesting pain meds-RN notified;RN gave pain meds during session    Home Living  Prior Function            PT Goals (current goals can now be found in the care plan section) Progress towards PT goals: Progressing toward goals    Frequency    7X/week      PT Plan Current plan remains appropriate    Co-evaluation              AM-PAC PT "6 Clicks" Daily Activity   Outcome Measure  Difficulty turning over in bed (including adjusting bedclothes, sheets and blankets)?: Unable Difficulty moving from lying on back to sitting on the side of the bed? : Unable Difficulty sitting down on and standing up from a chair with arms (e.g., wheelchair, bedside commode, etc,.)?: Unable Help needed moving to and from a bed to chair (including a wheelchair)?: A Little Help needed walking in hospital room?: A Little Help needed climbing 3-5 steps with a railing? : A Lot 6 Click Score: 11    End of Session Equipment Utilized During Treatment: Gait belt Activity Tolerance: Patient tolerated treatment well Patient left: in chair;with call bell/phone within reach Nurse Communication: Mobility status PT Visit Diagnosis: Unsteadiness on feet (R26.81);Muscle weakness (generalized) (M62.81);Difficulty in walking, not elsewhere classified (R26.2)     Time: 4098-1191 PT Time Calculation (min) (ACUTE ONLY): 26 min  Charges:  $Gait Training: 23-37 mins                     Erline Levine, PTA Acute Rehabilitation Services Pager: [phone] Office: [phone]     Carolynne Edouard 01/31/2018, 5:07 PM

## 2018-01-31 NOTE — Discharge Summary (Signed)
Patient ID: Chad Cameron MRN: [id number] DOB/AGE: [birth year] 84 y.o.  Admit date: 01/28/2018 Discharge date: 01/31/2018  Admission Diagnoses:  Principal Problem:   Unilateral primary osteoarthritis, right hip Active Problems:   Status post total replacement of right hip   Discharge Diagnoses:  Same  Past Medical History:  Diagnosis Date  . Anxiety   . Aortic aneurysm (HCC)   . Arthritis   . COPD (chronic obstructive pulmonary disease) (HCC)   . Coronary artery disease   . Dementia [facility])    " my memory is about gone"  . Depression   . Diabetes mellitus without complication (HCC)    Type II  . Dyspnea    with exertion  . GERD (gastroesophageal reflux disease) 01/30/2018  . Headache   . History of kidney stones     numerous, passed them  . Hypertension   . Neuromuscular disorder (HCC)    diabetic neuropathy  . Pneumonia 01/30/2018    x 2  . Schizophrenia (HCC)    " not had a problem with you."    Surgeries: Procedure(s): RIGHT TOTAL HIP ARTHROPLASTY ANTERIOR APPROACH on 01/28/2018   Consultants:   Discharged Condition: Improved  Hospital Course: Ameer Sanden is an 84 y.o. male who was admitted 01/28/2018 for operative treatment ofUnilateral primary osteoarthritis, right hip. Patient has severe unremitting pain that affects sleep, daily activities, and work/hobbies. After pre-op clearance the patient was taken to the operating room on 01/28/2018 and underwent  Procedure(s): RIGHT TOTAL HIP ARTHROPLASTY ANTERIOR APPROACH.    Patient was given perioperative antibiotics:  Anti-infectives (From admission, onward)   Start     Dose/Rate Route Frequency Ordered Stop   01/28/18 2232  ceFAZolin (ANCEF) IVPB 1 g/50 mL premix     1 g 100 mL/hr over 30 Minutes Intravenous Every 6 hours 01/28/18 2005 01/29/18 0422   01/28/18 1415  ceFAZolin (ANCEF) IVPB 2g/100 mL premix     2 g 200 mL/hr over 30 Minutes Intravenous To ShortStay Surgical 01/27/18 1322 01/28/18 1632       Patient  was given sequential compression devices, early ambulation, and chemoprophylaxis to prevent DVT.  Patient benefited maximally from hospital stay and there were no complications.    Recent vital signs:  Patient Vitals for the past 24 hrs:  BP Temp Temp src Pulse Resp SpO2  01/31/18 0337 125/74 98.9 F (37.2 C) Oral 95 18 97 %  01/30/18 2039 - - - - - 99 %  01/30/18 1935 (!) 129/59 99.3 F (37.4 C) Oral 95 18 98 %  01/30/18 1423 - - - - - 96 %  01/30/18 1208 (!) 160/72 97.8 F (36.6 C) Oral (!) 101 18 98 %  01/30/18 0950 131/64 99.6 F (37.6 C) Oral 95 18 97 %  01/30/18 0908 - - - - - 94 %  01/30/18 0800 (!) 143/66 - - 91 16 94 %     Recent laboratory studies:  Recent Labs    01/29/18 0251 01/30/18 0224  WBC 9.4 9.9  HGB 10.0* 9.3*  HCT 32.7* 30.3*  PLT 270 227  NA 138  --   K 5.2*  --   CL 101  --   CO2 32  --   BUN 9  --   CREATININE 1.04  --   GLUCOSE 181*  --   CALCIUM 8.5*  --      Discharge Medications:   Allergies as of 01/31/2018   No Known Allergies     Medication List  STOP taking these medications   HYDROcodone-acetaminophen 7.5-325 MG tablet Commonly known as:  NORCO Replaced by:  HYDROcodone-acetaminophen 5-325 MG tablet   traMADol 50 MG tablet Commonly known as:  ULTRAM     TAKE these medications   amLODipine 5 MG tablet Commonly known as:  NORVASC Take 5 mg by mouth daily.   aspirin EC 81 MG tablet Take 81 mg by mouth daily.   atorvastatin 80 MG tablet Commonly known as:  LIPITOR Take 80 mg by mouth at bedtime.   clopidogrel 75 MG tablet Commonly known as:  PLAVIX Take 75 mg by mouth daily.   docusate sodium 100 MG capsule Commonly known as:  COLACE Take 100 mg by mouth daily.   donepezil 5 MG tablet Commonly known as:  ARICEPT Take 5 mg by mouth at bedtime.   DULoxetine 60 MG capsule Commonly known as:  CYMBALTA Take 60 mg by mouth daily.   finasteride 5 MG tablet Commonly known as:  PROSCAR Take 5 mg by mouth at  bedtime.   gabapentin 300 MG capsule Commonly known as:  NEURONTIN Take 600 mg by mouth 3 (three) times daily.   HYDROcodone-acetaminophen 5-325 MG tablet Commonly known as:  NORCO/VICODIN Take 1-2 tablets by mouth every 6 (six) hours as needed for moderate pain. Replaces:  HYDROcodone-acetaminophen 7.5-325 MG tablet   insulin glargine 100 UNIT/ML injection Commonly known as:  LANTUS Inject 50 Units into the skin at bedtime.   lisinopril 40 MG tablet Commonly known as:  PRINIVIL,ZESTRIL Take 40 mg by mouth daily.   methocarbamol 500 MG tablet Commonly known as:  ROBAXIN Take 1 tablet (500 mg total) by mouth every 6 (six) hours as needed for muscle spasms.   omeprazole 20 MG capsule Commonly known as:  PRILOSEC Take 40 mg by mouth 2 (two) times daily before a meal.   pregabalin 150 MG capsule Commonly known as:  LYRICA Take 150 mg by mouth 2 (two) times daily.   PROAIR HFA 108 (90 Base) MCG/ACT inhaler Generic drug:  albuterol Inhale 2 puffs into the lungs every 6 (six) hours.   traZODone 150 MG tablet Commonly known as:  DESYREL Take 150 mg by mouth at bedtime.   Vitamin D3 50 MCG (2000 UT) capsule Take 2,000 Units by mouth daily.            Durable Medical Equipment  (From admission, onward)         Start     Ordered   01/28/18 2006  DME 3 n 1  Once     01/28/18 2005   01/28/18 2006  DME Walker rolling  Once    Question:  Patient needs a walker to treat with the following condition  Answer:  Status post total replacement of right hip   01/28/18 2005          Diagnostic Studies: Dg Pelvis Portable  Result Date: 01/28/2018 CLINICAL DATA:  Right hip replacement EXAM: DG C-ARM 61-120 MIN; PORTABLE PELVIS 1-2 VIEWS; OPERATIVE RIGHT HIP WITH PELVIS COMPARISON:  11/18/2017 FINDINGS: Two C-arm images show total hip replacement on the right. Components appear well positioned. No radiographically detectable complication. IMPRESSION: Total hip replacement on the  right. Electronically Signed   By: Paulina Fusi M.D.   On: 01/28/2018 19:38   Dg C-arm 1-60 Min  Result Date: 01/28/2018 CLINICAL DATA:  Right hip replacement EXAM: DG C-ARM 61-120 MIN; PORTABLE PELVIS 1-2 VIEWS; OPERATIVE RIGHT HIP WITH PELVIS COMPARISON:  11/18/2017 FINDINGS: Two C-arm images show  total hip replacement on the right. Components appear well positioned. No radiographically detectable complication. IMPRESSION: Total hip replacement on the right. Electronically Signed   By: Paulina Fusi M.D.   On: 01/28/2018 19:38   Dg Hip Operative Unilat W Or W/o Pelvis Right  Result Date: 01/28/2018 CLINICAL DATA:  Right hip replacement EXAM: DG C-ARM 61-120 MIN; PORTABLE PELVIS 1-2 VIEWS; OPERATIVE RIGHT HIP WITH PELVIS COMPARISON:  11/18/2017 FINDINGS: Two C-arm images show total hip replacement on the right. Components appear well positioned. No radiographically detectable complication. IMPRESSION: Total hip replacement on the right. Electronically Signed   By: Paulina Fusi M.D.   On: 01/28/2018 19:38   Xr Hip Unilat W Or W/o Pelvis 1v Right  Result Date: 01/01/2018 An AP pelvis and lateral of the right hip show severe end-stage arthritis.  There is significant para-articular osteophytes and almost complete loss of joint space.  There looks like this may be a component of osteonecrosis as well.  In light of a mechanical fall with significant trauma there is likely a component of necrosis of the hip joint.   Disposition: Discharge disposition: 03-Skilled Nursing Facility         Follow-up Information    Kathryne Hitch, MD Follow up in 2 week(s).   Specialty:  Orthopedic Surgery Contact information: [address] [place] [address] [phone]            Signed: Kathryne Hitch 01/31/2018, 6:45 AM

## 2018-01-31 NOTE — Progress Notes (Signed)
CSW followed up this morning from vm left yesterday to [facility]. Once again, no answer at admissions. CSW called back to speak with secretary who gave CSW cell number of [facility] admissions, CSW called, lvm, and texted.   An hour later CSW called, lvm and texted twice again.   This afternoon after multiple attempts CSW heard back from [facility] regarding CSW's request to start insurance authorization yesterday. They did not start insurance auth yesterday but will start today, admissions reports they anticipate weekend discharge to [facility].   [place], [place] [phone]

## 2018-02-01 ENCOUNTER — Encounter (HOSPITAL_COMMUNITY): Payer: Self-pay

## 2018-02-01 LAB — GLUCOSE, CAPILLARY
Glucose-Capillary: 117 mg/dL — ABNORMAL HIGH (ref 70–99)
Glucose-Capillary: 131 mg/dL — ABNORMAL HIGH (ref 70–99)

## 2018-02-01 NOTE — Clinical Social Work Placement (Signed)
   CLINICAL SOCIAL WORK PLACEMENT  NOTE  Date:  02/01/2018  Patient Details  Name: Chad Cameron MRN: [id number] Date of Birth: 1931-11-01  Clinical Social Work is seeking post-discharge placement for this patient at the Skilled  Nursing Facility level of care (*CSW will initial, date and re-position this form in  chart as items are completed):  Yes   Patient/family provided with [facility] Clinical Social Work Department's list of facilities offering this level of care within the geographic area requested by the patient (or if unable, by the patient's family).  Yes   Patient/family informed of their freedom to choose among providers that offer the needed level of care, that participate in Medicare, Medicaid or managed care program needed by the patient, have an available bed and are willing to accept the patient.      Patient/family informed of [facility]'s ownership interest in [facility] and [facility], as well as of the fact that they are under no obligation to receive care at these facilities.  PASRR submitted to EDS on       PASRR number received on 01/30/18     Existing PASRR number confirmed on       FL2 transmitted to all facilities in geographic area requested by pt/family on 01/30/18     FL2 transmitted to all facilities within larger geographic area on       Patient informed that his/her managed care company has contracts with or will negotiate with certain facilities, including the following:        Yes   Patient/family informed of bed offers received.  Patient chooses bed at [facility]     Physician recommends and patient chooses bed at      Patient to be transferred to [facility] on 02/01/18.  Patient to be transferred to facility by [facility]     Patient family notified on 02/01/18 of transfer.  Name of family member notified:  none identified to contact by patient     PHYSICIAN       Additional Comment:     _______________________________________________ Gildardo Griffes, LCSW 02/01/2018, 9:48 AM

## 2018-02-01 NOTE — Progress Notes (Signed)
Patient will DC WJ:XBJYN Chad Cameron Anticipated DC date: 02/01/18 Family notified: none identified by pt Transport by: Sharin Mons  Per MD patient ready for DC to [facility] . RN, patient, patient's family, and facility notified of DC. Discharge Summary sent to facility. RN given number for report [phone] Room 110P. DC packet on chart. Ambulance transport requested for patient for 12:00 pm.  CSW signing off.  [place], [place] [phone]

## 2018-02-01 NOTE — Progress Notes (Signed)
CSW contacted [facility], they report they will be ready for patient to be transported around noon to their facility.   CSW will follow up to obtain room number around noon and call [facility] then.   [place], [place] [phone]

## 2018-02-01 NOTE — Progress Notes (Signed)
Patient ID: Chad Cameron, male   DOB: 1931/09/20, 84 y.o.   MRN: [id number] Can go to skilled nursing today.  Discharge summary from yesterday is good.  No acute changes.

## 2018-02-01 NOTE — Progress Notes (Signed)
Physical Therapy Treatment Patient Details Name: Chad Cameron MRN: [id number] DOB: 12-21-1931 Today's Date: 02/01/2018    History of Present Illness 85yo male who received elective R direct anterior THA on 01/28/18. PMH anxiety, aortic aneurysm, COPD, dementia, DM, HTN, neuropathy, schizophrenia, cardiac cath, hx foot fracture surgery, lumbar laminectomy, hx vascular surgery     PT Comments    Pt was in bed upon arrival. Pt was pleasant and A&O. He participated in supine exercises and was limited by pain. He was agreeable to bed mobility and gait training this session. He required Max A for bed mobility to progress his R LE off EOB and to elevate trunk. Pt ambulated 35 ft this session requiring Min A and cueing for sequencing. During gt training pt stated he felt dizzy; BP 128/62 and SPO2 100 on 2L O2. RN was notified about BP. Pt continues to progress toward stated goals and would benefit from continued skilled PT in order to increase functional independence. Pt still seems appropriate for SNF d/c based on current mobility status.   Follow Up Recommendations  Follow surgeon's recommendation for DC plan and follow-up therapies;SNF     Equipment Recommendations  Other (comment)    Recommendations for Other Services Speech consult     Precautions / Restrictions Precautions Precautions: Fall Precaution Booklet Issued: No Precaution Comments: no precautions - direct anterior Restrictions Weight Bearing Restrictions: Yes RLE Weight Bearing: Weight bearing as tolerated    Mobility  Bed Mobility Overal bed mobility: Needs Assistance Bed Mobility: Supine to Sit     Supine to sit: Max assist;HOB elevated     General bed mobility comments: Assistance required to bring R LE/hips to EOB and to elevate trunk into sitting. Cues to use UE balance once upright.   Transfers Overall transfer level: Needs assistance Equipment used: Rolling walker (2 wheeled) Transfers: Sit to/from Stand Sit  to Stand: Mod assist;+2 safety/equipment;From elevated surface         General transfer comment: cues for safe hand placement; assist to power up into standing. When sitting in chair pt required + 2 to safely descend due to dizziness.  Ambulation/Gait   Gait Distance (Feet): 35 Feet Assistive device: Rolling walker (2 wheeled) , Close chair follow Gait Pattern/deviations: Decreased step length - left;Decreased dorsiflexion - right;Decreased weight shift to right;Antalgic;Step-through pattern;Decreased stance time - right;Decreased stride length;Shuffle;Narrow base of support;Trunk flexed     General Gait Details: cues for posture, proximity to RW, and sequencing    Stairs             Wheelchair Mobility    Modified Rankin (Stroke Patients Only)       Balance Overall balance assessment: Needs assistance Sitting-balance support: Feet supported;Bilateral upper extremity supported Sitting balance-Leahy Scale: Poor Sitting balance - Comments: posterior lean and L lateral lean initially  Postural control: Posterior lean;Left lateral lean Standing balance support: Bilateral upper extremity supported;During functional activity Standing balance-Leahy Scale: Poor Standing balance comment: MinA for safety, ongoing heavy reliance on B UE support                             Cognition Arousal/Alertness: Awake/alert Behavior During Therapy: Flat affect Overall Cognitive Status: No family/caregiver present to determine baseline cognitive functioning Area of Impairment: Problem solving                   Current Attention Level: Sustained Memory: Decreased recall of precautions;Decreased short-term memory Following Commands: Follows  one step commands inconsistently;Follows one step commands with increased time Safety/Judgement: Decreased awareness of safety;Decreased awareness of deficits Awareness: Intellectual Problem Solving: Slow processing;Requires verbal  cues General Comments: Pt became dizzy during gait training in hallway. Pt sat down in chair with feet elevated and reclined. After VS were taken and stable, pt stated he felt better and would like to go back to his room.       Exercises Total Joint Exercises Ankle Circles/Pumps: AROM;15 reps;Right;Left;Supine Heel Slides: AAROM;Right;10 reps;Supine;Limitations Heel Slides Limitations: required assist due to pain Hip ABduction/ADduction: AROM;Right;10 reps;Supine    General Comments        Pertinent Vitals/Pain Pain Assessment: 0-10 Pain Score: 6  Pain Location: R hip  Pain Descriptors / Indicators: Aching;Sore;Guarding Pain Intervention(s): Limited activity within patient's tolerance;Monitored during session    Home Living                      Prior Function            PT Goals (current goals can now be found in the care plan section) Acute Rehab PT Goals Patient Stated Goal: "walk again" PT Goal Formulation: Patient unable to participate in goal setting Progress towards PT goals: Progressing toward goals    Frequency    7X/week      PT Plan Current plan remains appropriate    Co-evaluation              AM-PAC PT "6 Clicks" Daily Activity  Outcome Measure  Difficulty turning over in bed (including adjusting bedclothes, sheets and blankets)?: Unable Difficulty moving from lying on back to sitting on the side of the bed? : A Lot Difficulty sitting down on and standing up from a chair with arms (e.g., wheelchair, bedside commode, etc,.)?: A Lot Help needed moving to and from a bed to chair (including a wheelchair)?: A Little Help needed walking in hospital room?: A Little Help needed climbing 3-5 steps with a railing? : A Lot 6 Click Score: 13    End of Session Equipment Utilized During Treatment: Gait belt Activity Tolerance: Patient tolerated treatment well;Other (comment)(Pt felt dizzy during gait training) Patient left: in chair;with call  bell/phone within reach Nurse Communication: Mobility status PT Visit Diagnosis: Unsteadiness on feet (R26.81);Muscle weakness (generalized) (M62.81);Difficulty in walking, not elsewhere classified (R26.2)     Time: 1105-1140 PT Time Calculation (min) (ACUTE ONLY): 35 min  Charges:  $Gait Training: 8-22 mins $Therapeutic Exercise: 8-22 mins                     [address] SPTA   [place] 02/01/2018, 1:21 PM

## 2018-02-01 NOTE — Progress Notes (Addendum)
Called to give report on pt, gave [facility] discharge summary and discharge orders,spoke with Betie explained his blood sugar was 131 but has not had lunch as of yet

## 2018-02-11 ENCOUNTER — Encounter (INDEPENDENT_AMBULATORY_CARE_PROVIDER_SITE_OTHER): Payer: Self-pay | Admitting: Orthopaedic Surgery

## 2018-02-11 ENCOUNTER — Ambulatory Visit (INDEPENDENT_AMBULATORY_CARE_PROVIDER_SITE_OTHER): Payer: Medicare Other | Admitting: Orthopaedic Surgery

## 2018-02-11 DIAGNOSIS — Z96641 Presence of right artificial hip joint: Secondary | ICD-10-CM

## 2018-02-11 MED ORDER — HYDROCODONE-ACETAMINOPHEN 5-325 MG PO TABS: 1.0000 | ORAL_TABLET | Freq: Four times a day (QID) | ORAL | 0 refills | Status: DC | PRN

## 2018-02-11 MED ORDER — METHOCARBAMOL 500 MG PO TABS: 500.0000 mg | ORAL_TABLET | Freq: Four times a day (QID) | ORAL | 3 refills | Status: DC | PRN

## 2018-02-11 NOTE — Progress Notes (Addendum)
HPI: [name] returns today first postop visit status post right total hip now 14 days postop.  He states that overall he is quite sore he is working on range of motion strengthening.  He is ambulating with a cane.  He has had no shortness of breath fevers chills chest pain.  He has some tenderness in his right calf but has had no swelling.  Is on 81 mg aspirin twice daily for surgery which he remains on.  Requesting refill on his Robaxin and hydrocodone.  Physical exam: Right surgical incision is healing well as well approximated staples no signs of infection does have a slight abrasion at the very proximal end of the incision though.  No evidence of seroma.  Right calf supple with minimal tenderness.  He is able to get on and off the exam table on his own.  Impression: Status post right total hip arthroplasty  Plan: Staples were removed Steri-Strips applied.  He is able to get the incision wet shower I advised him to keep the proximal end of the incision in particular dry.  We will see him back in 2 weeks for a wound check mostly check the most proximal end of the incision he will follow-up sooner if there is any questions or concerns.  He develops any significant calf pain or swelling capital call and we will obtain a Doppler to rule out DVT.  He will remain on his 81 mg aspirin twice daily.  Refill on Robaxin hydrocodone was sent in for him.

## 2018-02-25 ENCOUNTER — Ambulatory Visit (INDEPENDENT_AMBULATORY_CARE_PROVIDER_SITE_OTHER): Payer: Medicare Other | Admitting: Orthopaedic Surgery

## 2018-02-25 ENCOUNTER — Encounter (INDEPENDENT_AMBULATORY_CARE_PROVIDER_SITE_OTHER): Payer: Self-pay | Admitting: Orthopaedic Surgery

## 2018-02-25 DIAGNOSIS — Z96641 Presence of right artificial hip joint: Secondary | ICD-10-CM

## 2018-02-25 NOTE — Progress Notes (Signed)
HPI: Chad Cameron returns today for follow-up of his right hip now 28 days status post right total hip arthroplasty.  He states the hip is overall doing well.  He has had no fevers chills shortness of breath chest pain.  He is asking about bilateral knee injections she has known arthritis of both knees.  He is diabetic states that the cortisone injections did not cause his glucose levels to rise much and that he has good control of his diabetes.  Physical exam: Right hip surgical incisions healing well no signs of infection.  He has good range of motion right hip without significant pain.  He is ambulate with a cane.  Impression: Status post right total hip arthroplasty 01/28/2018  Plan: He will follow-up with us in 1 month sooner if there is any questions or concerns.  He will continue work on range of motion strengthening hip.  At that time we will inject both knees with cortisone.  Questions were encouraged and answered at length.

## 2018-03-25 ENCOUNTER — Ambulatory Visit (INDEPENDENT_AMBULATORY_CARE_PROVIDER_SITE_OTHER): Payer: Medicare Other | Admitting: Orthopaedic Surgery

## 2018-03-25 ENCOUNTER — Encounter (INDEPENDENT_AMBULATORY_CARE_PROVIDER_SITE_OTHER): Payer: Self-pay | Admitting: Orthopaedic Surgery

## 2018-03-25 DIAGNOSIS — G8929 Other chronic pain: Secondary | ICD-10-CM

## 2018-03-25 DIAGNOSIS — M25561 Pain in right knee: Secondary | ICD-10-CM

## 2018-03-25 DIAGNOSIS — Z96641 Presence of right artificial hip joint: Secondary | ICD-10-CM

## 2018-03-25 DIAGNOSIS — M25562 Pain in left knee: Secondary | ICD-10-CM

## 2018-03-25 MED ORDER — METHYLPREDNISOLONE ACETATE 40 MG/ML IJ SUSP
40.0000 mg | INTRAMUSCULAR | Status: AC | PRN
  Administered 2018-03-25: 40 mg via INTRA_ARTICULAR

## 2018-03-25 MED ORDER — LIDOCAINE HCL 1 % IJ SOLN
0.5000 mL | INTRAMUSCULAR | Status: AC | PRN
  Administered 2018-03-25: .5 mL

## 2018-03-25 MED ORDER — HYDROCODONE-ACETAMINOPHEN 5-325 MG PO TABS: 1.0000 | ORAL_TABLET | Freq: Four times a day (QID) | ORAL | 0 refills | Status: DC | PRN

## 2018-03-25 MED ORDER — METHOCARBAMOL 500 MG PO TABS: 500.0000 mg | ORAL_TABLET | Freq: Four times a day (QID) | ORAL | 3 refills | Status: AC | PRN

## 2018-03-25 NOTE — Progress Notes (Signed)
Office Visit Note   Patient: Chad Cameron           Date of Birth: 10-Nov-1931           MRN: [id number] Visit Date: 03/25/2018              Requested by: Christella ScheuermannLawrence, Emma V, PA-C [address] [place], [place] [id number] PCP: Christella ScheuermannLawrence, Emma V, PA-C   Assessment & Plan: Visit Diagnoses:  1. Status post total replacement of right hip   2. Chronic pain of both knees     Plan: We will have him continue to work on range of motion strengthening of the right hip.  In regards to his knees like to obtain AP and lateral views of both knees at next office visit is that he has no films currently available on epic.  He is to monitor his glucose levels closely over the next couple of days.  He understands that the Depo-Medrol definitely can raise his glucose levels that he does state that he has had injections in both knees before and has had no problems with this.  Follow-Up Instructions: Return in about 4 weeks (around 04/22/2018).   Orders:  No orders of the defined types were placed in this encounter.  No orders of the defined types were placed in this encounter.     Procedures: Large Joint Inj: bilateral knee on 03/25/2018 9:28 AM Indications: pain Details: 22 G 1.5 in needle, anterolateral approach  Arthrogram: No  Medications (Right): 0.5 mL lidocaine 1 %; 40 mg methylPREDNISolone acetate 40 MG/ML Medications (Left): 0.5 mL lidocaine 1 %; 40 mg methylPREDNISolone acetate 40 MG/ML Outcome: tolerated well, no immediate complications Procedure, treatment alternatives, risks and benefits explained, specific risks discussed. Consent was given by the patient. Immediately prior to procedure a time out was called to verify the correct patient, procedure, equipment, support staff and site/side marked as required. Patient was prepped and draped in the usual sterile fashion.       Clinical Data: No additional findings.   Subjective: Chief Complaint  Patient presents with  . Right Hip -  Routine Post Op    HPI Chad Cameron returns today 56 days status post right total hip arthroplasty.  He states he still having pain in the hip particular lateral aspect of the hip.  He does note that his pain is different but he states that as bad as it was prior to surgery.  He has difficulty particularly first thing in the morning.  He is complaining of bilateral knee pain states that he has known bone-on-bone bilateral knees.  He is requesting cortisone injections both knees.  He is diabetic and states his glucose levels are well controlled.  He checks his glucose levels daily. Review of Systems Please see HPI otherwise negative  Objective: Vital Signs: There were no vitals taken for this visit.  Physical Exam  General: Well-developed well-nourished male no acute distress mood and affect appropriate  Ortho Exam Right hip slight discomfort with extremes of internal and external rotation otherwise fluid motion.  Right calf supple nontender.  Bilateral knees he has tenderness along medial lateral joint lines of both knees.  No instability of either knee.  No abnormal warmth erythema or effusion of either knee.  Full extension and flexion of both knees. Specialty Comments:  No specialty comments available.  Imaging: No results found.   PMFS History: Patient Active Problem List   Diagnosis Date Noted  . Status post total replacement of right hip  01/28/2018  . Unilateral primary osteoarthritis, right hip 01/01/2018  . S/P lumbar laminectomy 11/02/2016   Past Medical History:  Diagnosis Date  . Anxiety   . Aortic aneurysm (HCC)   . Arthritis   . COPD (chronic obstructive pulmonary disease) (HCC)   . Coronary artery disease   . Dementia [facility](HCC)    " my memory is about gone"  . Depression   . Diabetes mellitus without complication (HCC)    Type II  . Dyspnea    with exertion  . GERD (gastroesophageal reflux disease) 01/30/2018  . Headache   . History of kidney stones     numerous,  passed them  . Hypertension   . Neuromuscular disorder (HCC)    diabetic neuropathy  . Pneumonia 01/30/2018    x 2  . Schizophrenia (HCC)    " not had a problem with you."    Family History  Problem Relation Age of Onset  . Parkinson's disease Mother   . Ulcers Father   . Cancer - Other Father     Past Surgical History:  Procedure Laterality Date  . ADENOIDECTOMY    . CARDIAC CATHETERIZATION    . CATARACT EXTRACTION W/ INTRAOCULAR LENS IMPLANT Bilateral   . CHOLECYSTECTOMY    . COLONOSCOPY    . FOOT FRACTURE SURGERY Right   . LUMBAR LAMINECTOMY/DECOMPRESSION MICRODISCECTOMY Right 11/02/2016   Procedure: Laminectomy and Foraminotomy - Lumbar five-Sacral one - right;  Surgeon: Tia AlertJones, David S, MD;  [facility];  Service: Neurosurgery;  Laterality: Right;  . TONSILLECTOMY    . TOTAL HIP ARTHROPLASTY Right 01/28/2018   Procedure: RIGHT TOTAL HIP ARTHROPLASTY ANTERIOR APPROACH;  Surgeon: Kathryne HitchBlackman, Christopher Y, MD;  Location: [facility] OR;  Service: Orthopedics;  Laterality: Right;  Marland Kitchen. VASCULAR SURGERY Right    "Vein striped"   Social History   Occupational History  . Not on file  Tobacco Use  . Smoking status: Current Every Day Smoker    Years: 76.00    Types: Cigars  . Smokeless tobacco: Never Used  . Tobacco comment: little cigars - 10 a day  Substance and Sexual Activity  . Alcohol use: Yes    Alcohol/week: 7.0 standard drinks    Types: 7 Shots of liquor per week  . Drug use: No  . Sexual activity: Not on file

## 2018-04-22 ENCOUNTER — Ambulatory Visit (INDEPENDENT_AMBULATORY_CARE_PROVIDER_SITE_OTHER): Payer: Medicare Other | Admitting: Orthopaedic Surgery

## 2018-04-22 ENCOUNTER — Ambulatory Visit (INDEPENDENT_AMBULATORY_CARE_PROVIDER_SITE_OTHER): Payer: Medicare Other

## 2018-04-22 ENCOUNTER — Ambulatory Visit (INDEPENDENT_AMBULATORY_CARE_PROVIDER_SITE_OTHER): Payer: Self-pay

## 2018-04-22 ENCOUNTER — Encounter (INDEPENDENT_AMBULATORY_CARE_PROVIDER_SITE_OTHER): Payer: Self-pay | Admitting: Orthopaedic Surgery

## 2018-04-22 DIAGNOSIS — M25561 Pain in right knee: Secondary | ICD-10-CM | POA: Diagnosis not present

## 2018-04-22 DIAGNOSIS — Z96651 Presence of right artificial knee joint: Secondary | ICD-10-CM

## 2018-04-22 DIAGNOSIS — M25562 Pain in left knee: Secondary | ICD-10-CM | POA: Diagnosis not present

## 2018-04-22 DIAGNOSIS — M25512 Pain in left shoulder: Secondary | ICD-10-CM

## 2018-04-22 DIAGNOSIS — M25511 Pain in right shoulder: Secondary | ICD-10-CM | POA: Diagnosis not present

## 2018-04-22 DIAGNOSIS — G8929 Other chronic pain: Secondary | ICD-10-CM | POA: Diagnosis not present

## 2018-04-22 DIAGNOSIS — E119 Type 2 diabetes mellitus without complications: Secondary | ICD-10-CM

## 2018-04-22 MED ORDER — HYDROCODONE-ACETAMINOPHEN 5-325 MG PO TABS: 1.0000 | ORAL_TABLET | ORAL | 0 refills | Status: AC | PRN
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
## 2018-07-22 ENCOUNTER — Ambulatory Visit (INDEPENDENT_AMBULATORY_CARE_PROVIDER_SITE_OTHER): Payer: Medicare Other | Admitting: Orthopaedic Surgery

## 2018-08-25 ENCOUNTER — Ambulatory Visit: Payer: Medicare Other | Admitting: Orthopaedic Surgery

## 2018-08-27 ENCOUNTER — Other Ambulatory Visit: Payer: Self-pay

## 2018-08-27 ENCOUNTER — Encounter: Payer: Self-pay | Admitting: Physician Assistant

## 2018-08-27 ENCOUNTER — Ambulatory Visit (INDEPENDENT_AMBULATORY_CARE_PROVIDER_SITE_OTHER): Payer: Medicare Other | Admitting: Physician Assistant

## 2018-08-27 VITALS — Ht 72.0 in | Wt 187.0 lb

## 2018-08-27 DIAGNOSIS — G8929 Other chronic pain: Secondary | ICD-10-CM | POA: Diagnosis not present

## 2018-08-27 DIAGNOSIS — M25512 Pain in left shoulder: Secondary | ICD-10-CM

## 2018-08-27 DIAGNOSIS — M25511 Pain in right shoulder: Secondary | ICD-10-CM

## 2018-08-27 MED ORDER — METHYLPREDNISOLONE ACETATE 40 MG/ML IJ SUSP
40.0000 mg | INTRAMUSCULAR | Status: AC | PRN
  Administered 2018-08-27: 40 mg via INTRA_ARTICULAR

## 2018-08-27 MED ORDER — LIDOCAINE HCL 1 % IJ SOLN
0.5000 mL | INTRAMUSCULAR | Status: AC | PRN
  Administered 2018-08-27: .5 mL

## 2018-08-27 NOTE — Progress Notes (Signed)
   Procedure Note  Patient: Chad Cameron             Date of Birth: 12-Mar-1932           MRN: [id number]             Visit Date: 08/27/2018 HPI: Mr. Chad Cameron well-known to Dr. Magnus Ivan service comes in today due to bilateral shoulder pain and bilateral knee pain.  He is requesting injections in all 4 joints.  He states that his glucose levels are running between 118 and 128.  He has had no known injury to either shoulder.  He has chronic shoulder pain is responding well to subacromial injections in the past.  Last injection was given in January 2020.  Review of systems: No fevers or chills.  Please see HPI otherwise negative or noncontributory.  Physical exam: Bilateral shoulders good range of motion bilateral shoulders.  5/ 5 strength with external/internal rotation against resistance empty can test negative bilaterally.   Procedures: Visit Diagnoses: Chronic left shoulder pain  Chronic right shoulder pain  Large Joint Inj: bilateral subacromial bursa on 08/27/2018 5:43 PM Indications: pain Details: 22 G 1.5 in needle, lateral approach  Arthrogram: No  Medications (Right): 0.5 mL lidocaine 1 %; 40 mg methylPREDNISolone acetate 40 MG/ML Medications (Left): 0.5 mL lidocaine 1 %; 40 mg methylPREDNISolone acetate 40 MG/ML Outcome: tolerated well, no immediate complications Procedure, treatment alternatives, risks and benefits explained, specific risks discussed. Consent was given by the patient. Immediately prior to procedure a time out was called to verify the correct patient, procedure, equipment, support staff and site/side marked as required. Patient was prepped and draped in the usual sterile fashion.     Plan: He will follow-up in 2 weeks for injections in both knees.  He is to be mindful of his glucose levels over the next few days.  Questions were encouraged and answered at length.

## 2018-09-10 ENCOUNTER — Encounter: Payer: Self-pay | Admitting: Physician Assistant

## 2018-09-10 ENCOUNTER — Ambulatory Visit (INDEPENDENT_AMBULATORY_CARE_PROVIDER_SITE_OTHER): Payer: Medicare Other | Admitting: Physician Assistant

## 2018-09-10 ENCOUNTER — Other Ambulatory Visit: Payer: Self-pay

## 2018-09-10 DIAGNOSIS — M1711 Unilateral primary osteoarthritis, right knee: Secondary | ICD-10-CM | POA: Diagnosis not present

## 2018-09-10 DIAGNOSIS — M1712 Unilateral primary osteoarthritis, left knee: Secondary | ICD-10-CM | POA: Diagnosis not present

## 2018-09-10 MED ORDER — LIDOCAINE HCL 1 % IJ SOLN
0.5000 mL | INTRAMUSCULAR | Status: AC | PRN
  Administered 2018-09-10: .5 mL

## 2018-09-10 MED ORDER — METHYLPREDNISOLONE ACETATE 40 MG/ML IJ SUSP
40.0000 mg | INTRAMUSCULAR | Status: AC | PRN
  Administered 2018-09-10: 40 mg via INTRA_ARTICULAR

## 2018-09-10 NOTE — Progress Notes (Signed)
   Procedure Note  Patient: Chad Cameron             Date of Birth: 1932-01-28           MRN: [id number]             Visit Date: 09/10/2018 Mr. [name] returns today requesting injections in both knees.  He had both shoulders injected 2 weeks ago he states that it did raise his glucose levels up in 200s but he covered it with his insulin.  He has known end-stage osteoarthritis of the left knee and moderate osteoarthritis of the right knee.  He has had no new injury to either knee.  Bilateral knees: No effusion abnormal warmth erythema.  Good range of motion of both knees.  Tenderness along medial joint line of the left knee. Procedures: Visit Diagnoses: Primary osteoarthritis right knee Primary osteoarthritis left knee  Large Joint Inj: bilateral knee on 09/10/2018 9:41 AM Indications: pain Details: 22 G 1.5 in needle, anterolateral approach  Arthrogram: No  Medications (Right): 0.5 mL lidocaine 1 %; 40 mg methylPREDNISolone acetate 40 MG/ML Medications (Left): 0.5 mL lidocaine 1 %; 40 mg methylPREDNISolone acetate 40 MG/ML Outcome: tolerated well, no immediate complications Procedure, treatment alternatives, risks and benefits explained, specific risks discussed. Consent was given by the patient. Immediately prior to procedure a time out was called to verify the correct patient, procedure, equipment, support staff and site/side marked as required. Patient was prepped and draped in the usual sterile fashion.     Plan: He will monitor his glucose levels closely over the next few days and cover this with insulin.  He understands he needs to wait at least 3 months between the injections.  Questions were encouraged and answered at length today.  Follow-up as needed

## 2018-12-10 ENCOUNTER — Ambulatory Visit (INDEPENDENT_AMBULATORY_CARE_PROVIDER_SITE_OTHER): Payer: Medicare Other | Admitting: Physician Assistant

## 2018-12-10 ENCOUNTER — Encounter: Payer: Self-pay | Admitting: Physician Assistant

## 2018-12-10 VITALS — Ht 72.0 in | Wt 187.0 lb

## 2018-12-10 DIAGNOSIS — M1711 Unilateral primary osteoarthritis, right knee: Secondary | ICD-10-CM | POA: Diagnosis not present

## 2018-12-10 DIAGNOSIS — M1712 Unilateral primary osteoarthritis, left knee: Secondary | ICD-10-CM | POA: Diagnosis not present

## 2018-12-10 MED ORDER — METHYLPREDNISOLONE ACETATE 40 MG/ML IJ SUSP
40.0000 mg | INTRAMUSCULAR | Status: AC | PRN
  Administered 2018-12-10: 40 mg via INTRA_ARTICULAR

## 2018-12-10 MED ORDER — LIDOCAINE HCL 1 % IJ SOLN
0.5000 mL | INTRAMUSCULAR | Status: AC | PRN
  Administered 2018-12-10: .5 mL

## 2018-12-10 NOTE — Progress Notes (Signed)
   Procedure Note  Patient: Chad Cameron             Date of Birth: 26-Jul-1931           MRN: [id number]             Visit Date: 12/10/2018  HPI: Mr.Power comes in today requesting injection of both shoulders both knees.  The wants cortisone injections in all 4 joints.  However reminded him that whenever he got injections into the joints he did raise his glucose levels in the 200 range and he had a coverage with insulin.  He has had no new injury. After talking with him he decides to inject both knees today and come back later for bilateral shoulder injections.  Physical exam: Bilateral knees good range of motion.  No abnormal warmth erythema or effusion.   Procedures: Visit Diagnoses:  1. Primary osteoarthritis of left knee   2. Primary osteoarthritis of right knee     Large Joint Inj: bilateral knee on 12/10/2018 3:30 PM Indications: pain Details: 22 G 1.5 in needle, anterolateral approach  Arthrogram: No  Medications (Right): 0.5 mL lidocaine 1 %; 40 mg methylPREDNISolone acetate 40 MG/ML Medications (Left): 0.5 mL lidocaine 1 %; 40 mg methylPREDNISolone acetate 40 MG/ML Outcome: tolerated well, no immediate complications Procedure, treatment alternatives, risks and benefits explained, specific risks discussed. Consent was given by the patient. Immediately prior to procedure a time out was called to verify the correct patient, procedure, equipment, support staff and site/side marked as required. Patient was prepped and draped in the usual sterile fashion.     Plan: He will watch his glucose levels over the next few days.  He will follow-up with [place] in 2 weeks for injections in both shoulders.  Questions encouraged and answered at length

## 2018-12-20 IMAGING — CT CT HEAD W/O CM
4 series · 17 of 47 positions shown, 19 images · non-contrast
Comparison: CT scan of September 18, 2007.

CLINICAL DATA: Gait disturbance.

EXAM:
CT HEAD WITHOUT CONTRAST
TECHNIQUE: Contiguous axial images were obtained from the base of the skull
through the vertex without intravenous contrast.

[Series 2: head 5.00 hr40 s3 ibhc · axial · 0.46mm/px · z∈[-502,-382]mm · 7 of 32 slices shown, 9 images]
[im 4/32  brain]
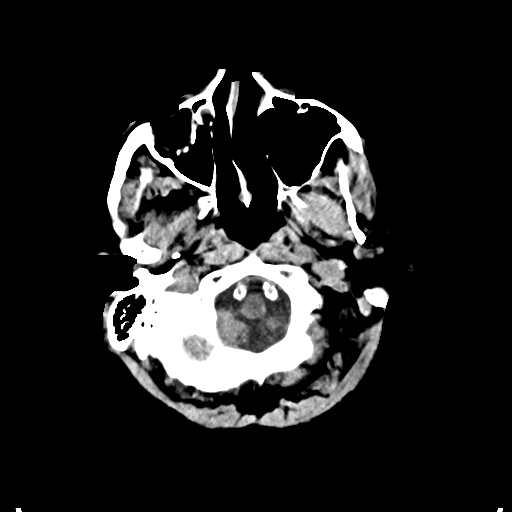
[im 4/32  bone]
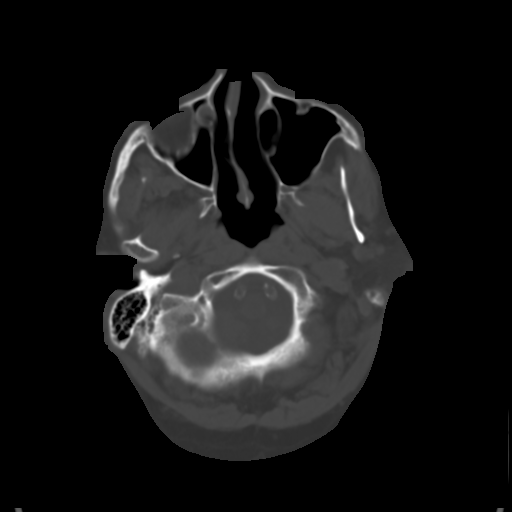
[im 8/32  brain]
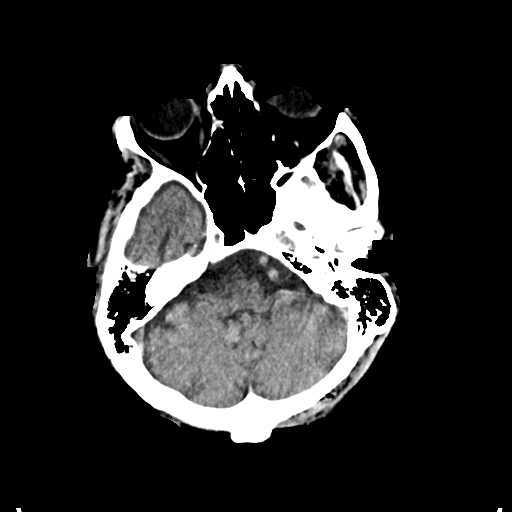
[im 12/32  brain]
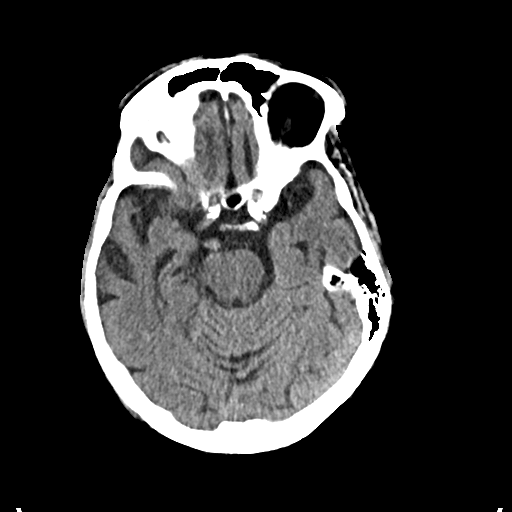
[im 16/32  brain]
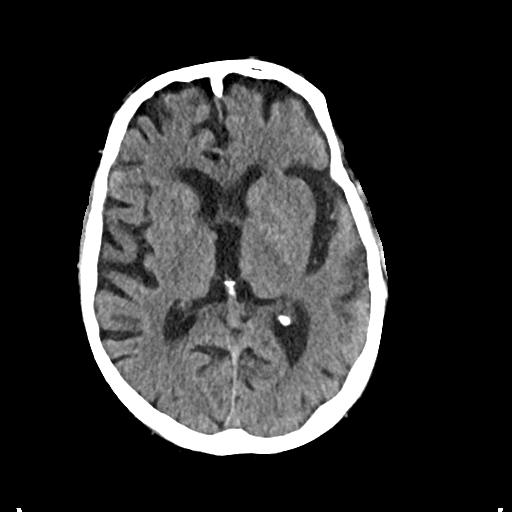
[im 20/32  brain]
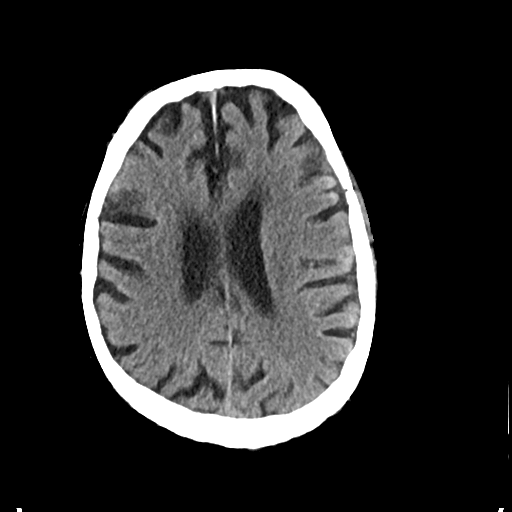
[im 20/32  bone]
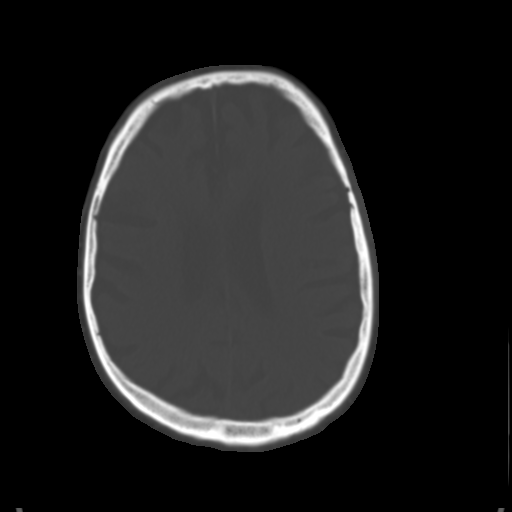
[im 24/32  brain]
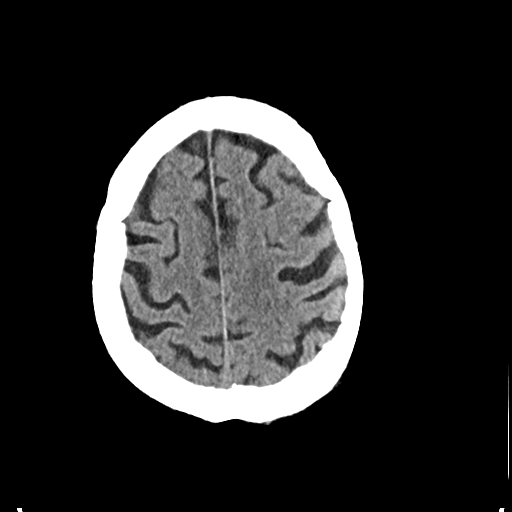
[im 28/32  brain]
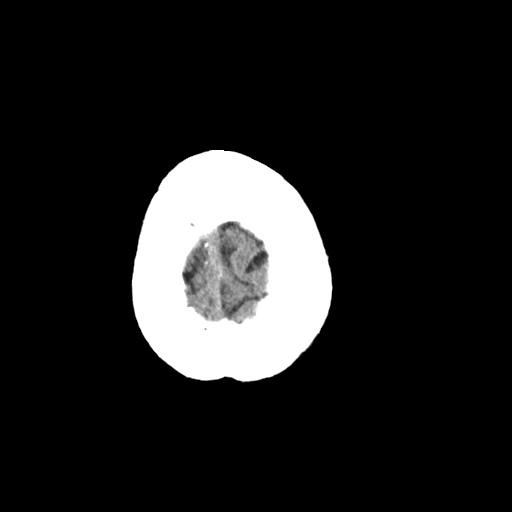

[Series 3: head 2.00 hr60 s3 bone · axial · 0.46mm/px · z∈[-504,-448]mm · 4 of 80 slices shown]
[im 8/80  bone]
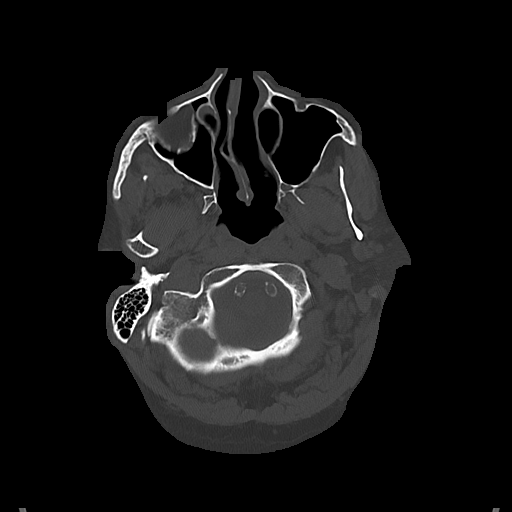
[im 16/80  bone]
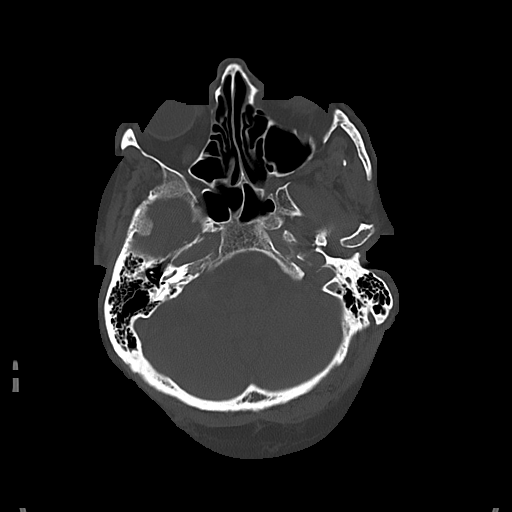
[im 24/80  bone]
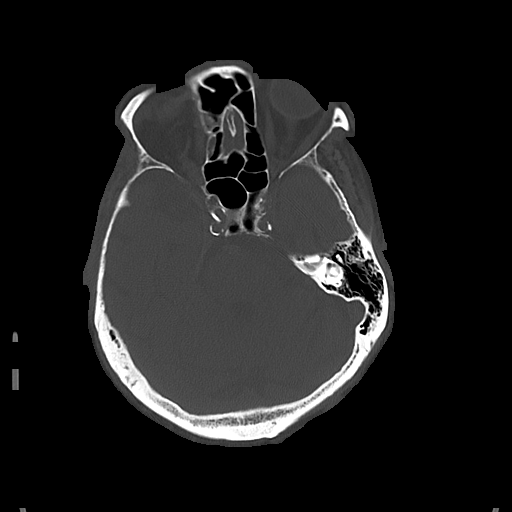
[im 36/80  bone]
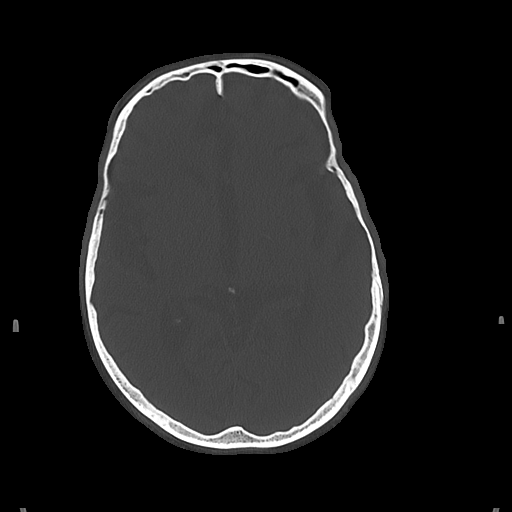

[Series 4: head 3.00 hr40 s3 sag · sagittal · 0.33mm/px · 3 of 54 slices shown]
[im 19/54  brain]
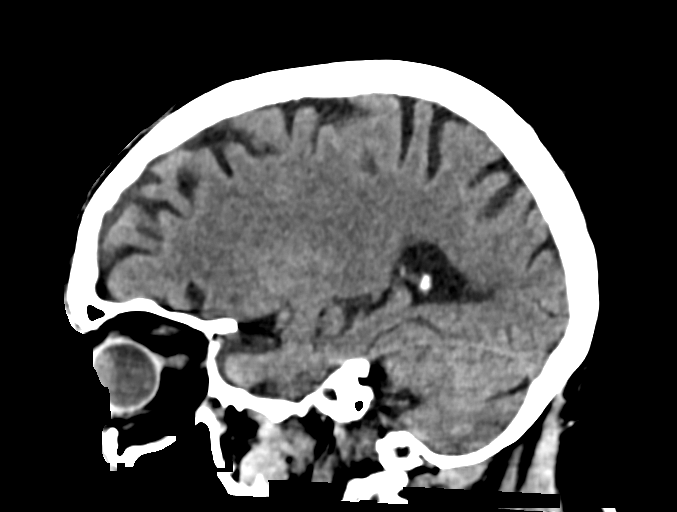
[im 27/54  brain]
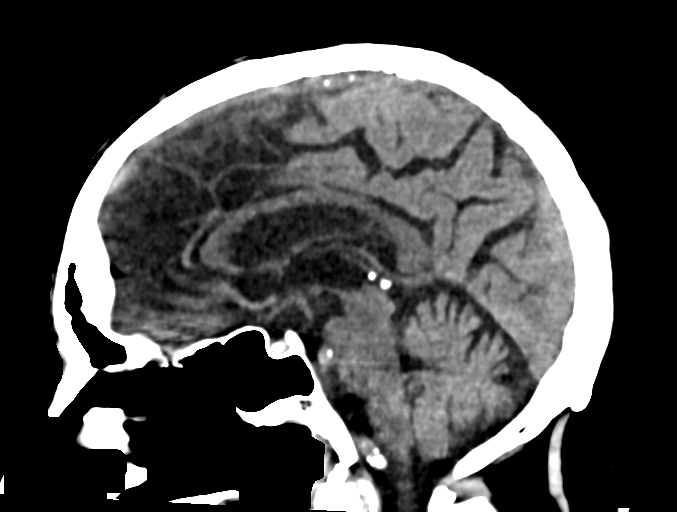
[im 35/54  brain]
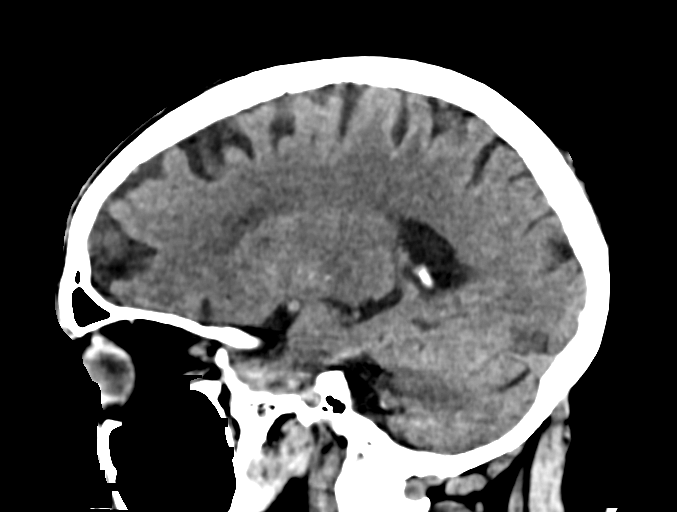

[Series 6: head 3.00 hr40 s3 cor · coronal · 0.32mm/px · 3 of 74 slices shown]
[im 25/74  brain]
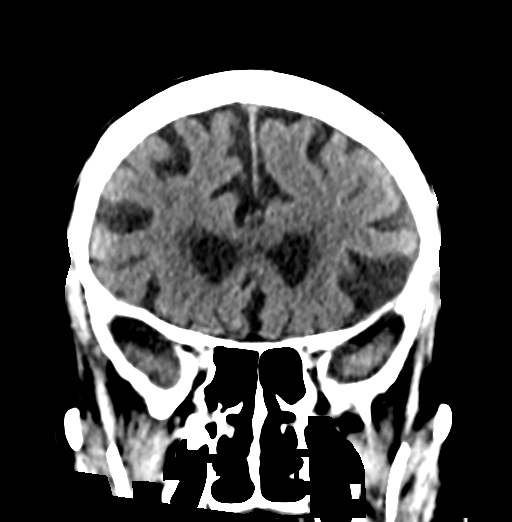
[im 33/74  brain]
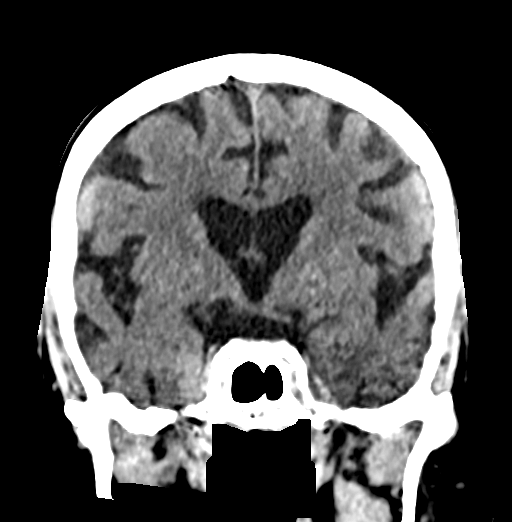
[im 41/74  brain]
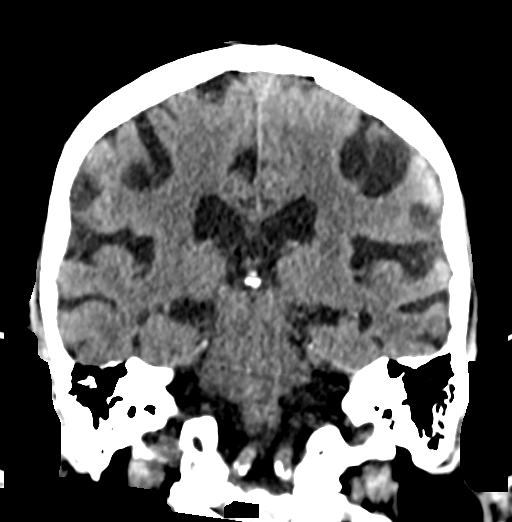

[17 of 47 positions shown; findings below may reference images not displayed]

FINDINGS: Brain: Mild diffuse cortical atrophy is noted. Mild chronic ischemic
white matter disease is noted. No mass effect or midline shift is
noted. Ventricular size is within normal limits. There is no
evidence of mass lesion, hemorrhage or acute infarction.

Vascular: No hyperdense vessel or unexpected calcification.

Skull: Normal. Negative for fracture or focal lesion.

Sinuses/Orbits: No acute finding.

Other: None.
IMPRESSION: Mild diffuse cortical atrophy. Mild chronic ischemic white matter
disease. No acute intracranial abnormality seen.

## 2018-12-24 ENCOUNTER — Encounter: Payer: Self-pay | Admitting: Physician Assistant

## 2018-12-24 ENCOUNTER — Ambulatory Visit (INDEPENDENT_AMBULATORY_CARE_PROVIDER_SITE_OTHER): Payer: Medicare Other | Admitting: Physician Assistant

## 2018-12-24 DIAGNOSIS — M25511 Pain in right shoulder: Secondary | ICD-10-CM

## 2018-12-24 DIAGNOSIS — G8929 Other chronic pain: Secondary | ICD-10-CM | POA: Diagnosis not present

## 2018-12-24 DIAGNOSIS — M25512 Pain in left shoulder: Secondary | ICD-10-CM | POA: Diagnosis not present

## 2018-12-24 MED ORDER — Medication
Status: DC
Start: ? — End: 2018-12-24

## 2018-12-24 MED ORDER — METHYLPREDNISOLONE ACETATE 40 MG/ML IJ SUSP
40.0000 mg | INTRAMUSCULAR | Status: AC | PRN
  Administered 2018-12-24: 40 mg via INTRA_ARTICULAR

## 2018-12-24 MED ORDER — LIDOCAINE HCL 1 % IJ SOLN
0.5000 mL | INTRAMUSCULAR | Status: AC | PRN
  Administered 2018-12-24: 15:00:00 .5 mL

## 2018-12-24 MED ORDER — LIDOCAINE HCL 1 % IJ SOLN
0.5000 mL | INTRAMUSCULAR | Status: AC | PRN
  Administered 2018-12-24: .5 mL

## 2018-12-24 MED ORDER — BARO-CAT PO
10.00 | ORAL | Status: DC
Start: ? — End: 2018-12-24

## 2018-12-24 NOTE — Progress Notes (Signed)
   Procedure Note  Patient: Chad Cameron             Date of Birth: [birth year]           MRN: [id number]             Visit Date: 12/24/2018  HPI: Mr. full returns today requesting injections both shoulders.  He states the injections both knees helped some.  Today is right shoulder spine more than the left.  Does note that the injection of both knees did raise his glucose levels minimally.  Physical exam: Pain with any attempted range of motion bilateral shoulders.  He has tenderness over the right shoulder greater tuberosity .  Procedures: Visit Diagnoses:  1. Chronic right shoulder pain   2. Chronic left shoulder pain     Large Joint Inj: bilateral subacromial bursa on 12/24/2018 2:33 PM Indications: pain Details: 22 G 1.5 in needle, lateral approach  Arthrogram: No  Medications (Right): 0.5 mL lidocaine 1 %; 40 mg methylPREDNISolone acetate 40 MG/ML Medications (Left): 0.5 mL lidocaine 1 %; 40 mg methylPREDNISolone acetate 40 MG/ML Outcome: tolerated well, no immediate complications Procedure, treatment alternatives, risks and benefits explained, specific risks discussed. Consent was given by the patient. Immediately prior to procedure a time out was called to verify the correct patient, procedure, equipment, support staff and site/side marked as required. Patient was prepped and draped in the usual sterile fashion.      Plan: He will follow-up with [place] in 3 months for repeat injection of his shoulders.  Questions encouraged and answered at length.  He knows to monitor his glucose levels over the next few days.

## 2019-01-13 DIAGNOSIS — M810 Age-related osteoporosis without current pathological fracture: Secondary | ICD-10-CM | POA: Insufficient documentation

## 2019-01-13 DIAGNOSIS — K224 Dyskinesia of esophagus: Secondary | ICD-10-CM | POA: Insufficient documentation

## 2019-03-22 DIAGNOSIS — I509 Heart failure, unspecified: Secondary | ICD-10-CM | POA: Insufficient documentation

## 2019-03-25 ENCOUNTER — Ambulatory Visit: Payer: Medicare Other | Admitting: Physician Assistant

## 2019-04-14 DIAGNOSIS — H903 Sensorineural hearing loss, bilateral: Secondary | ICD-10-CM | POA: Insufficient documentation

## 2019-04-21 DIAGNOSIS — I5022 Chronic systolic (congestive) heart failure: Secondary | ICD-10-CM | POA: Insufficient documentation

## 2019-04-21 DIAGNOSIS — I10 Essential (primary) hypertension: Secondary | ICD-10-CM | POA: Insufficient documentation

## 2019-04-21 DIAGNOSIS — I255 Ischemic cardiomyopathy: Secondary | ICD-10-CM | POA: Insufficient documentation

## 2019-04-30 IMAGING — RF DG HIP (WITH PELVIS) OPERATIVE*R*
1 series · 2 of 2 positions shown · non-contrast
Comparison: 11/18/2017

CLINICAL DATA: Right hip replacement

EXAM:
DG C-ARM 61-120 MIN; PORTABLE PELVIS 1-2 VIEWS; OPERATIVE RIGHT HIP
WITH PELVIS

[Series 1: run · 2 of 2 slices shown]
[im 1/2]
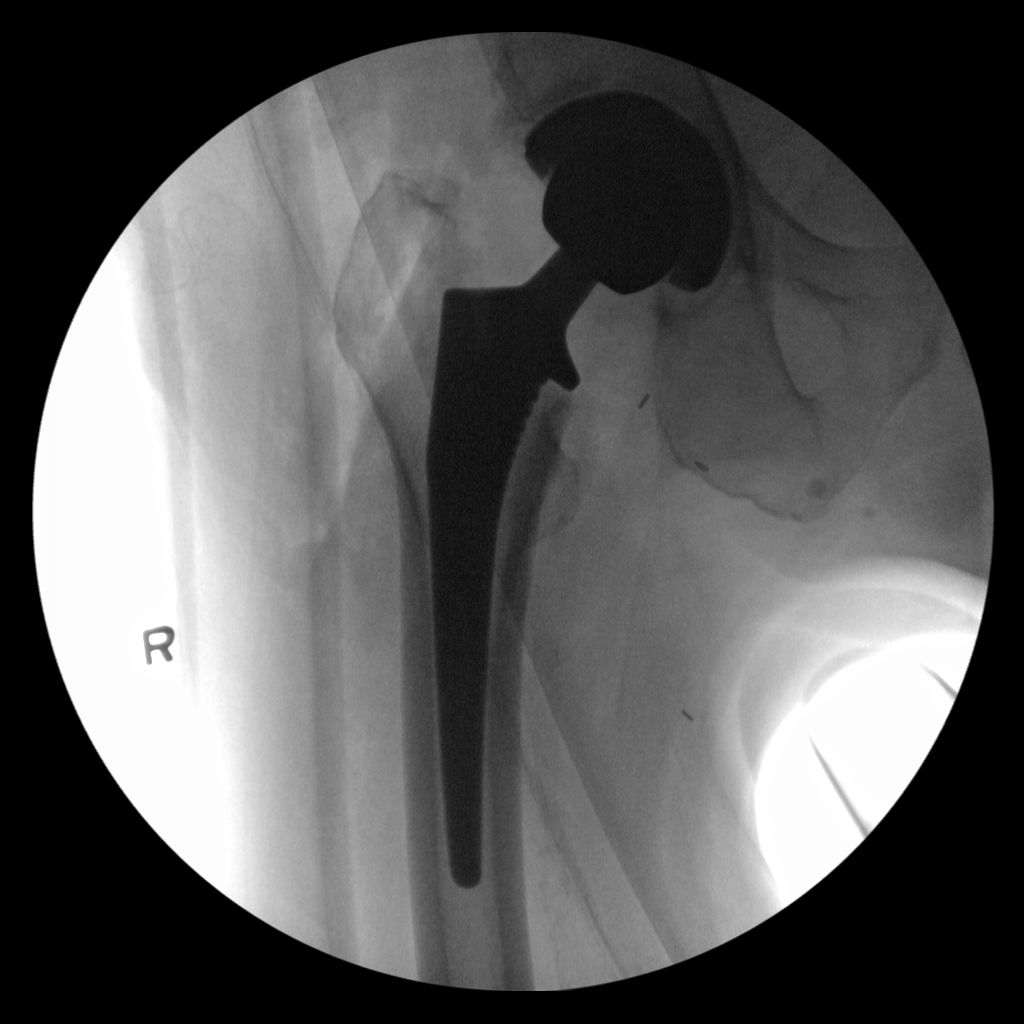
[im 2/2]
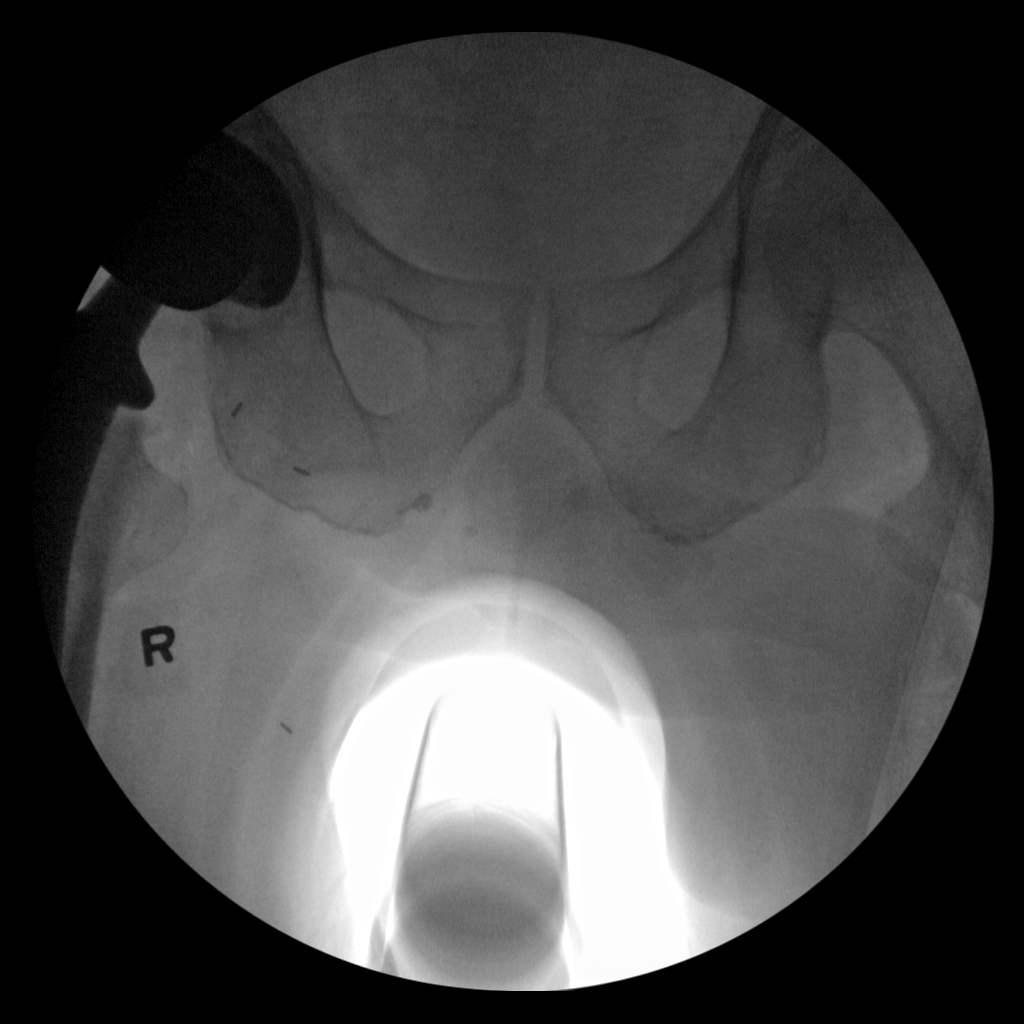

[2 of 2 positions shown; findings below may reference images not displayed]

FINDINGS: Two C-arm images show total hip replacement on the right. Components
appear well positioned. No radiographically detectable complication.
IMPRESSION: Total hip replacement on the right.

## 2019-04-30 IMAGING — DX DG PORTABLE PELVIS
1 series · 1 of 1 positions shown · non-contrast
Comparison: 11/18/2017

CLINICAL DATA: Right hip replacement

EXAM:
DG C-ARM 61-120 MIN; PORTABLE PELVIS 1-2 VIEWS; OPERATIVE RIGHT HIP
WITH PELVIS

[pelvis ap]
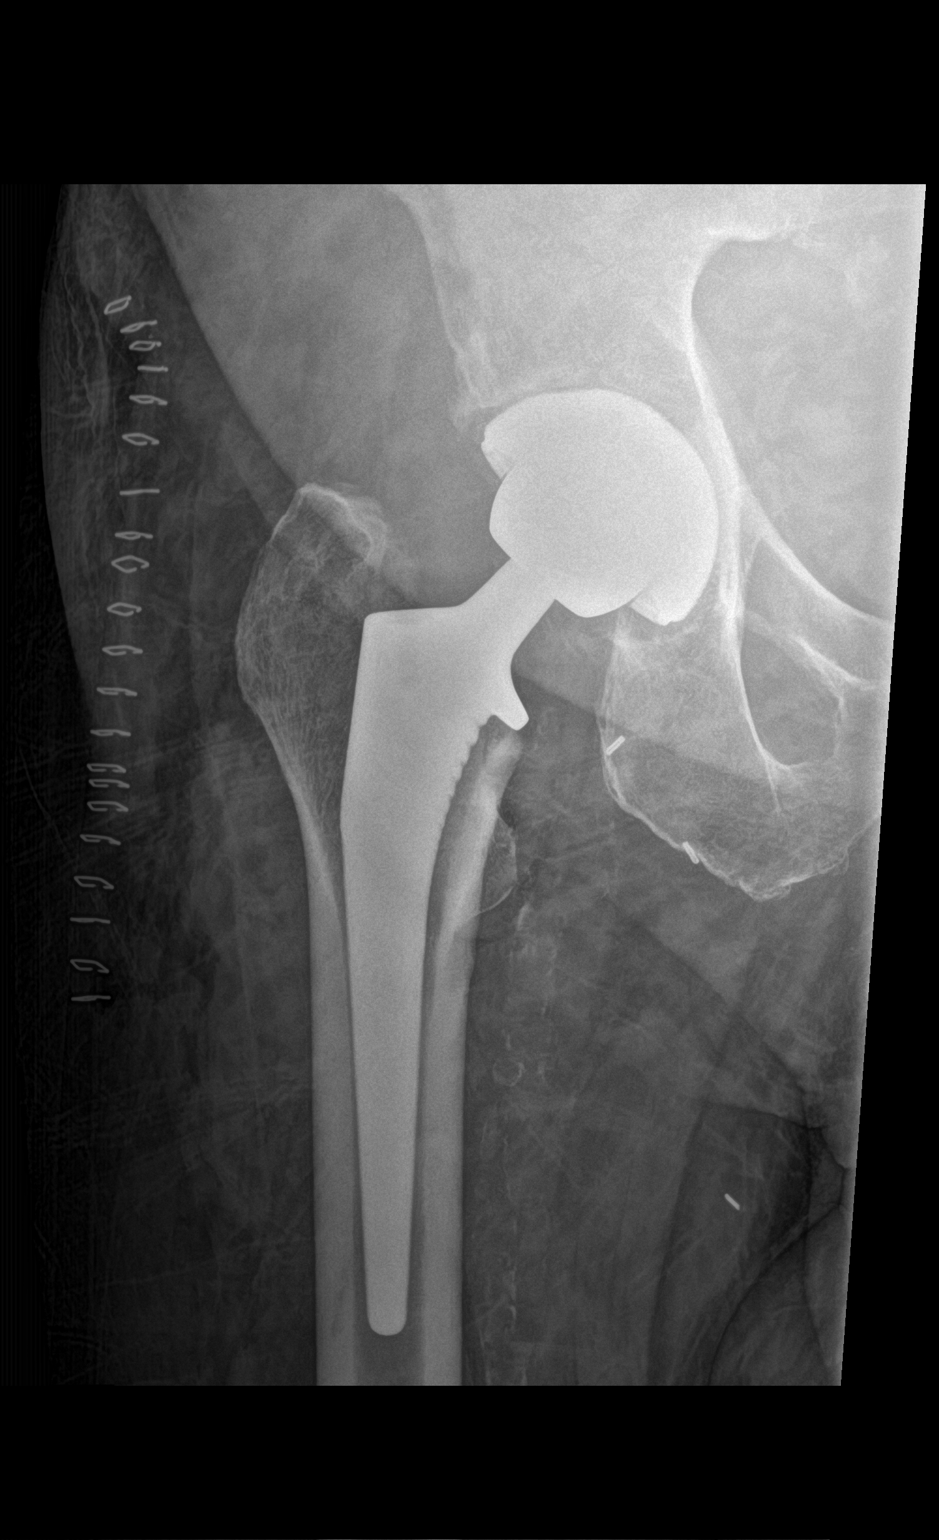

[1 of 1 positions shown; findings below may reference images not displayed]

FINDINGS: Two C-arm images show total hip replacement on the right. Components
appear well positioned. No radiographically detectable complication.
IMPRESSION: Total hip replacement on the right.

## 2019-06-01 DIAGNOSIS — R29810 Facial weakness: Secondary | ICD-10-CM | POA: Diagnosis not present

## 2019-06-01 DIAGNOSIS — I639 Cerebral infarction, unspecified: Secondary | ICD-10-CM | POA: Insufficient documentation

## 2019-06-01 DIAGNOSIS — R52 Pain, unspecified: Secondary | ICD-10-CM | POA: Diagnosis not present

## 2019-06-01 DIAGNOSIS — R110 Nausea: Secondary | ICD-10-CM | POA: Diagnosis not present

## 2019-06-01 DIAGNOSIS — J449 Chronic obstructive pulmonary disease, unspecified: Secondary | ICD-10-CM | POA: Diagnosis not present

## 2019-06-01 DIAGNOSIS — Z794 Long term (current) use of insulin: Secondary | ICD-10-CM | POA: Diagnosis not present

## 2019-06-01 DIAGNOSIS — Z7409 Other reduced mobility: Secondary | ICD-10-CM | POA: Diagnosis not present

## 2019-06-01 DIAGNOSIS — I2510 Atherosclerotic heart disease of native coronary artery without angina pectoris: Secondary | ICD-10-CM | POA: Diagnosis not present

## 2019-06-01 DIAGNOSIS — R4781 Slurred speech: Secondary | ICD-10-CM | POA: Diagnosis not present

## 2019-06-01 DIAGNOSIS — R0902 Hypoxemia: Secondary | ICD-10-CM | POA: Diagnosis not present

## 2019-06-01 DIAGNOSIS — E119 Type 2 diabetes mellitus without complications: Secondary | ICD-10-CM | POA: Diagnosis not present

## 2019-06-01 DIAGNOSIS — Z7982 Long term (current) use of aspirin: Secondary | ICD-10-CM | POA: Diagnosis not present

## 2019-06-01 DIAGNOSIS — Z87891 Personal history of nicotine dependence: Secondary | ICD-10-CM | POA: Diagnosis not present

## 2019-06-01 DIAGNOSIS — R1312 Dysphagia, oropharyngeal phase: Secondary | ICD-10-CM | POA: Diagnosis not present

## 2019-06-01 DIAGNOSIS — I110 Hypertensive heart disease with heart failure: Secondary | ICD-10-CM | POA: Diagnosis not present

## 2019-06-01 DIAGNOSIS — R471 Dysarthria and anarthria: Secondary | ICD-10-CM | POA: Diagnosis not present

## 2019-06-03 DIAGNOSIS — I517 Cardiomegaly: Secondary | ICD-10-CM | POA: Diagnosis not present

## 2019-06-03 DIAGNOSIS — I444 Left anterior fascicular block: Secondary | ICD-10-CM | POA: Diagnosis not present

## 2019-07-13 DIAGNOSIS — R0781 Pleurodynia: Secondary | ICD-10-CM | POA: Diagnosis not present

## 2019-07-13 DIAGNOSIS — W19XXXA Unspecified fall, initial encounter: Secondary | ICD-10-CM | POA: Diagnosis not present

## 2019-07-13 DIAGNOSIS — E1159 Type 2 diabetes mellitus with other circulatory complications: Secondary | ICD-10-CM | POA: Diagnosis not present

## 2019-07-13 DIAGNOSIS — I10 Essential (primary) hypertension: Secondary | ICD-10-CM | POA: Diagnosis not present

## 2019-07-23 DIAGNOSIS — R404 Transient alteration of awareness: Secondary | ICD-10-CM | POA: Diagnosis not present

## 2019-08-04 DIAGNOSIS — E1159 Type 2 diabetes mellitus with other circulatory complications: Secondary | ICD-10-CM | POA: Diagnosis not present

## 2019-08-04 DIAGNOSIS — I152 Hypertension secondary to endocrine disorders: Secondary | ICD-10-CM | POA: Diagnosis not present

## 2019-08-04 DIAGNOSIS — F5102 Adjustment insomnia: Secondary | ICD-10-CM | POA: Diagnosis not present

## 2019-08-25 DIAGNOSIS — I152 Hypertension secondary to endocrine disorders: Secondary | ICD-10-CM | POA: Diagnosis not present

## 2019-08-25 DIAGNOSIS — I712 Thoracic aortic aneurysm, without rupture: Secondary | ICD-10-CM | POA: Diagnosis not present

## 2019-08-25 DIAGNOSIS — E785 Hyperlipidemia, unspecified: Secondary | ICD-10-CM | POA: Diagnosis not present

## 2019-08-25 DIAGNOSIS — N189 Chronic kidney disease, unspecified: Secondary | ICD-10-CM | POA: Diagnosis not present

## 2019-08-25 DIAGNOSIS — E1169 Type 2 diabetes mellitus with other specified complication: Secondary | ICD-10-CM | POA: Diagnosis not present

## 2019-08-25 DIAGNOSIS — E1159 Type 2 diabetes mellitus with other circulatory complications: Secondary | ICD-10-CM | POA: Diagnosis not present

## 2019-08-25 DIAGNOSIS — I5032 Chronic diastolic (congestive) heart failure: Secondary | ICD-10-CM | POA: Diagnosis not present

## 2019-08-25 DIAGNOSIS — I2510 Atherosclerotic heart disease of native coronary artery without angina pectoris: Secondary | ICD-10-CM | POA: Diagnosis not present

## 2019-08-25 DIAGNOSIS — J441 Chronic obstructive pulmonary disease with (acute) exacerbation: Secondary | ICD-10-CM | POA: Diagnosis not present

## 2019-09-01 DIAGNOSIS — R0602 Shortness of breath: Secondary | ICD-10-CM | POA: Diagnosis not present

## 2019-09-01 DIAGNOSIS — R069 Unspecified abnormalities of breathing: Secondary | ICD-10-CM | POA: Diagnosis not present

## 2019-09-01 DIAGNOSIS — I130 Hypertensive heart and chronic kidney disease with heart failure and stage 1 through stage 4 chronic kidney disease, or unspecified chronic kidney disease: Secondary | ICD-10-CM | POA: Diagnosis not present

## 2019-09-01 DIAGNOSIS — R0902 Hypoxemia: Secondary | ICD-10-CM | POA: Diagnosis not present

## 2019-09-01 DIAGNOSIS — J441 Chronic obstructive pulmonary disease with (acute) exacerbation: Secondary | ICD-10-CM | POA: Diagnosis not present

## 2019-09-01 DIAGNOSIS — Z743 Need for continuous supervision: Secondary | ICD-10-CM | POA: Diagnosis not present

## 2019-09-01 DIAGNOSIS — I5033 Acute on chronic diastolic (congestive) heart failure: Secondary | ICD-10-CM | POA: Diagnosis not present

## 2019-11-15 DIAGNOSIS — R911 Solitary pulmonary nodule: Secondary | ICD-10-CM | POA: Diagnosis not present

## 2019-11-15 DIAGNOSIS — N179 Acute kidney failure, unspecified: Secondary | ICD-10-CM | POA: Diagnosis not present

## 2019-11-15 DIAGNOSIS — I110 Hypertensive heart disease with heart failure: Secondary | ICD-10-CM | POA: Diagnosis not present

## 2019-11-15 DIAGNOSIS — Z794 Long term (current) use of insulin: Secondary | ICD-10-CM | POA: Diagnosis not present

## 2019-11-15 DIAGNOSIS — R0602 Shortness of breath: Secondary | ICD-10-CM | POA: Diagnosis not present

## 2019-11-15 DIAGNOSIS — R069 Unspecified abnormalities of breathing: Secondary | ICD-10-CM | POA: Diagnosis not present

## 2019-11-15 DIAGNOSIS — W19XXXA Unspecified fall, initial encounter: Secondary | ICD-10-CM | POA: Diagnosis not present

## 2019-11-15 DIAGNOSIS — S51011A Laceration without foreign body of right elbow, initial encounter: Secondary | ICD-10-CM | POA: Diagnosis not present

## 2019-11-15 DIAGNOSIS — J441 Chronic obstructive pulmonary disease with (acute) exacerbation: Secondary | ICD-10-CM | POA: Diagnosis not present

## 2019-11-15 DIAGNOSIS — Z7951 Long term (current) use of inhaled steroids: Secondary | ICD-10-CM | POA: Diagnosis not present

## 2019-11-15 DIAGNOSIS — E871 Hypo-osmolality and hyponatremia: Secondary | ICD-10-CM | POA: Diagnosis not present

## 2019-11-15 DIAGNOSIS — Z20822 Contact with and (suspected) exposure to covid-19: Secondary | ICD-10-CM | POA: Diagnosis not present

## 2019-11-15 DIAGNOSIS — I5022 Chronic systolic (congestive) heart failure: Secondary | ICD-10-CM | POA: Diagnosis not present

## 2019-11-15 DIAGNOSIS — I444 Left anterior fascicular block: Secondary | ICD-10-CM | POA: Diagnosis not present

## 2019-11-15 DIAGNOSIS — E1169 Type 2 diabetes mellitus with other specified complication: Secondary | ICD-10-CM | POA: Diagnosis not present

## 2019-11-15 DIAGNOSIS — G4733 Obstructive sleep apnea (adult) (pediatric): Secondary | ICD-10-CM | POA: Diagnosis not present

## 2019-11-15 DIAGNOSIS — Z955 Presence of coronary angioplasty implant and graft: Secondary | ICD-10-CM | POA: Diagnosis not present

## 2019-11-15 DIAGNOSIS — I252 Old myocardial infarction: Secondary | ICD-10-CM | POA: Diagnosis not present

## 2019-11-15 DIAGNOSIS — E785 Hyperlipidemia, unspecified: Secondary | ICD-10-CM | POA: Diagnosis not present

## 2019-11-15 DIAGNOSIS — M25511 Pain in right shoulder: Secondary | ICD-10-CM | POA: Diagnosis not present

## 2019-11-15 DIAGNOSIS — Z7409 Other reduced mobility: Secondary | ICD-10-CM | POA: Diagnosis not present

## 2019-11-15 DIAGNOSIS — M8580 Other specified disorders of bone density and structure, unspecified site: Secondary | ICD-10-CM | POA: Diagnosis not present

## 2019-11-15 DIAGNOSIS — I712 Thoracic aortic aneurysm, without rupture: Secondary | ICD-10-CM | POA: Diagnosis not present

## 2019-11-15 DIAGNOSIS — R0989 Other specified symptoms and signs involving the circulatory and respiratory systems: Secondary | ICD-10-CM | POA: Diagnosis not present

## 2019-11-15 DIAGNOSIS — I2510 Atherosclerotic heart disease of native coronary artery without angina pectoris: Secondary | ICD-10-CM | POA: Diagnosis not present

## 2019-11-15 DIAGNOSIS — R0600 Dyspnea, unspecified: Secondary | ICD-10-CM | POA: Diagnosis not present

## 2019-11-15 DIAGNOSIS — K219 Gastro-esophageal reflux disease without esophagitis: Secondary | ICD-10-CM | POA: Diagnosis not present

## 2019-11-15 DIAGNOSIS — S5001XA Contusion of right elbow, initial encounter: Secondary | ICD-10-CM | POA: Diagnosis not present

## 2019-11-15 DIAGNOSIS — Z743 Need for continuous supervision: Secondary | ICD-10-CM | POA: Diagnosis not present

## 2019-11-15 DIAGNOSIS — I517 Cardiomegaly: Secondary | ICD-10-CM | POA: Diagnosis not present

## 2019-11-16 DIAGNOSIS — E1169 Type 2 diabetes mellitus with other specified complication: Secondary | ICD-10-CM | POA: Diagnosis not present

## 2019-11-16 DIAGNOSIS — I712 Thoracic aortic aneurysm, without rupture: Secondary | ICD-10-CM | POA: Diagnosis not present

## 2019-11-16 DIAGNOSIS — I110 Hypertensive heart disease with heart failure: Secondary | ICD-10-CM | POA: Diagnosis not present

## 2019-11-16 DIAGNOSIS — Z7409 Other reduced mobility: Secondary | ICD-10-CM | POA: Diagnosis not present

## 2019-11-16 DIAGNOSIS — G4733 Obstructive sleep apnea (adult) (pediatric): Secondary | ICD-10-CM | POA: Diagnosis not present

## 2019-11-16 DIAGNOSIS — I252 Old myocardial infarction: Secondary | ICD-10-CM | POA: Diagnosis not present

## 2019-11-16 DIAGNOSIS — E785 Hyperlipidemia, unspecified: Secondary | ICD-10-CM | POA: Diagnosis not present

## 2019-11-16 DIAGNOSIS — R911 Solitary pulmonary nodule: Secondary | ICD-10-CM | POA: Diagnosis not present

## 2019-11-16 DIAGNOSIS — J441 Chronic obstructive pulmonary disease with (acute) exacerbation: Secondary | ICD-10-CM | POA: Diagnosis not present

## 2019-11-16 DIAGNOSIS — I444 Left anterior fascicular block: Secondary | ICD-10-CM | POA: Diagnosis not present

## 2019-11-16 DIAGNOSIS — Z20822 Contact with and (suspected) exposure to covid-19: Secondary | ICD-10-CM | POA: Diagnosis not present

## 2019-11-16 DIAGNOSIS — E119 Type 2 diabetes mellitus without complications: Secondary | ICD-10-CM | POA: Diagnosis not present

## 2019-11-16 DIAGNOSIS — Z7951 Long term (current) use of inhaled steroids: Secondary | ICD-10-CM | POA: Diagnosis not present

## 2019-11-16 DIAGNOSIS — E871 Hypo-osmolality and hyponatremia: Secondary | ICD-10-CM | POA: Insufficient documentation

## 2019-11-16 DIAGNOSIS — Z955 Presence of coronary angioplasty implant and graft: Secondary | ICD-10-CM | POA: Diagnosis not present

## 2019-11-16 DIAGNOSIS — K219 Gastro-esophageal reflux disease without esophagitis: Secondary | ICD-10-CM | POA: Diagnosis not present

## 2019-11-16 DIAGNOSIS — N179 Acute kidney failure, unspecified: Secondary | ICD-10-CM | POA: Diagnosis not present

## 2019-11-16 DIAGNOSIS — M8580 Other specified disorders of bone density and structure, unspecified site: Secondary | ICD-10-CM | POA: Diagnosis not present

## 2019-11-16 DIAGNOSIS — R0989 Other specified symptoms and signs involving the circulatory and respiratory systems: Secondary | ICD-10-CM | POA: Diagnosis not present

## 2019-11-16 DIAGNOSIS — Z794 Long term (current) use of insulin: Secondary | ICD-10-CM | POA: Diagnosis not present

## 2019-11-16 DIAGNOSIS — I2510 Atherosclerotic heart disease of native coronary artery without angina pectoris: Secondary | ICD-10-CM | POA: Diagnosis not present

## 2019-11-16 DIAGNOSIS — I5022 Chronic systolic (congestive) heart failure: Secondary | ICD-10-CM | POA: Diagnosis not present

## 2019-11-16 DIAGNOSIS — S51011A Laceration without foreign body of right elbow, initial encounter: Secondary | ICD-10-CM | POA: Diagnosis not present

## 2019-11-17 DIAGNOSIS — S51011A Laceration without foreign body of right elbow, initial encounter: Secondary | ICD-10-CM | POA: Diagnosis not present

## 2019-11-17 DIAGNOSIS — Z7951 Long term (current) use of inhaled steroids: Secondary | ICD-10-CM | POA: Diagnosis not present

## 2019-11-17 DIAGNOSIS — G4733 Obstructive sleep apnea (adult) (pediatric): Secondary | ICD-10-CM | POA: Diagnosis not present

## 2019-11-17 DIAGNOSIS — I252 Old myocardial infarction: Secondary | ICD-10-CM | POA: Diagnosis not present

## 2019-11-17 DIAGNOSIS — I712 Thoracic aortic aneurysm, without rupture: Secondary | ICD-10-CM | POA: Diagnosis not present

## 2019-11-17 DIAGNOSIS — Z7409 Other reduced mobility: Secondary | ICD-10-CM | POA: Diagnosis not present

## 2019-11-17 DIAGNOSIS — R0602 Shortness of breath: Secondary | ICD-10-CM | POA: Diagnosis not present

## 2019-11-17 DIAGNOSIS — Z794 Long term (current) use of insulin: Secondary | ICD-10-CM | POA: Diagnosis not present

## 2019-11-17 DIAGNOSIS — K219 Gastro-esophageal reflux disease without esophagitis: Secondary | ICD-10-CM | POA: Diagnosis not present

## 2019-11-17 DIAGNOSIS — I2510 Atherosclerotic heart disease of native coronary artery without angina pectoris: Secondary | ICD-10-CM | POA: Diagnosis not present

## 2019-11-17 DIAGNOSIS — Z955 Presence of coronary angioplasty implant and graft: Secondary | ICD-10-CM | POA: Diagnosis not present

## 2019-11-17 DIAGNOSIS — R0989 Other specified symptoms and signs involving the circulatory and respiratory systems: Secondary | ICD-10-CM | POA: Diagnosis not present

## 2019-11-17 DIAGNOSIS — Z20822 Contact with and (suspected) exposure to covid-19: Secondary | ICD-10-CM | POA: Diagnosis not present

## 2019-11-17 DIAGNOSIS — E785 Hyperlipidemia, unspecified: Secondary | ICD-10-CM | POA: Diagnosis not present

## 2019-11-17 DIAGNOSIS — R911 Solitary pulmonary nodule: Secondary | ICD-10-CM | POA: Diagnosis not present

## 2019-11-17 DIAGNOSIS — N179 Acute kidney failure, unspecified: Secondary | ICD-10-CM | POA: Diagnosis not present

## 2019-11-17 DIAGNOSIS — I444 Left anterior fascicular block: Secondary | ICD-10-CM | POA: Diagnosis not present

## 2019-11-17 DIAGNOSIS — I110 Hypertensive heart disease with heart failure: Secondary | ICD-10-CM | POA: Diagnosis not present

## 2019-11-17 DIAGNOSIS — J441 Chronic obstructive pulmonary disease with (acute) exacerbation: Secondary | ICD-10-CM | POA: Diagnosis not present

## 2019-11-17 DIAGNOSIS — E1169 Type 2 diabetes mellitus with other specified complication: Secondary | ICD-10-CM | POA: Diagnosis not present

## 2019-11-17 DIAGNOSIS — I5022 Chronic systolic (congestive) heart failure: Secondary | ICD-10-CM | POA: Diagnosis not present

## 2019-11-17 DIAGNOSIS — E119 Type 2 diabetes mellitus without complications: Secondary | ICD-10-CM | POA: Diagnosis not present

## 2019-11-17 DIAGNOSIS — M8580 Other specified disorders of bone density and structure, unspecified site: Secondary | ICD-10-CM | POA: Diagnosis not present

## 2019-11-17 DIAGNOSIS — E871 Hypo-osmolality and hyponatremia: Secondary | ICD-10-CM | POA: Diagnosis not present

## 2019-11-18 DIAGNOSIS — Z794 Long term (current) use of insulin: Secondary | ICD-10-CM | POA: Diagnosis not present

## 2019-11-18 DIAGNOSIS — K219 Gastro-esophageal reflux disease without esophagitis: Secondary | ICD-10-CM | POA: Diagnosis not present

## 2019-11-18 DIAGNOSIS — Z20822 Contact with and (suspected) exposure to covid-19: Secondary | ICD-10-CM | POA: Diagnosis not present

## 2019-11-18 DIAGNOSIS — M8580 Other specified disorders of bone density and structure, unspecified site: Secondary | ICD-10-CM | POA: Diagnosis not present

## 2019-11-18 DIAGNOSIS — Z7409 Other reduced mobility: Secondary | ICD-10-CM | POA: Diagnosis not present

## 2019-11-18 DIAGNOSIS — Z955 Presence of coronary angioplasty implant and graft: Secondary | ICD-10-CM | POA: Diagnosis not present

## 2019-11-18 DIAGNOSIS — S51011A Laceration without foreign body of right elbow, initial encounter: Secondary | ICD-10-CM | POA: Diagnosis not present

## 2019-11-18 DIAGNOSIS — R0602 Shortness of breath: Secondary | ICD-10-CM | POA: Diagnosis not present

## 2019-11-18 DIAGNOSIS — I2510 Atherosclerotic heart disease of native coronary artery without angina pectoris: Secondary | ICD-10-CM | POA: Diagnosis not present

## 2019-11-18 DIAGNOSIS — E119 Type 2 diabetes mellitus without complications: Secondary | ICD-10-CM | POA: Diagnosis not present

## 2019-11-18 DIAGNOSIS — J441 Chronic obstructive pulmonary disease with (acute) exacerbation: Secondary | ICD-10-CM | POA: Diagnosis not present

## 2019-11-18 DIAGNOSIS — R0989 Other specified symptoms and signs involving the circulatory and respiratory systems: Secondary | ICD-10-CM | POA: Diagnosis not present

## 2019-11-18 DIAGNOSIS — Z7951 Long term (current) use of inhaled steroids: Secondary | ICD-10-CM | POA: Diagnosis not present

## 2019-11-18 DIAGNOSIS — I444 Left anterior fascicular block: Secondary | ICD-10-CM | POA: Diagnosis not present

## 2019-11-18 DIAGNOSIS — I517 Cardiomegaly: Secondary | ICD-10-CM | POA: Diagnosis not present

## 2019-11-18 DIAGNOSIS — E1169 Type 2 diabetes mellitus with other specified complication: Secondary | ICD-10-CM | POA: Diagnosis not present

## 2019-11-18 DIAGNOSIS — N179 Acute kidney failure, unspecified: Secondary | ICD-10-CM | POA: Diagnosis not present

## 2019-11-18 DIAGNOSIS — G4733 Obstructive sleep apnea (adult) (pediatric): Secondary | ICD-10-CM | POA: Diagnosis not present

## 2019-11-18 DIAGNOSIS — I110 Hypertensive heart disease with heart failure: Secondary | ICD-10-CM | POA: Diagnosis not present

## 2019-11-18 DIAGNOSIS — E871 Hypo-osmolality and hyponatremia: Secondary | ICD-10-CM | POA: Diagnosis not present

## 2019-11-18 DIAGNOSIS — I5022 Chronic systolic (congestive) heart failure: Secondary | ICD-10-CM | POA: Diagnosis not present

## 2019-11-18 DIAGNOSIS — I712 Thoracic aortic aneurysm, without rupture: Secondary | ICD-10-CM | POA: Diagnosis not present

## 2019-11-18 DIAGNOSIS — I252 Old myocardial infarction: Secondary | ICD-10-CM | POA: Diagnosis not present

## 2019-11-18 DIAGNOSIS — E785 Hyperlipidemia, unspecified: Secondary | ICD-10-CM | POA: Diagnosis not present

## 2019-11-18 DIAGNOSIS — R911 Solitary pulmonary nodule: Secondary | ICD-10-CM | POA: Diagnosis not present

## 2019-11-19 DIAGNOSIS — E1169 Type 2 diabetes mellitus with other specified complication: Secondary | ICD-10-CM | POA: Diagnosis not present

## 2019-11-19 DIAGNOSIS — R079 Chest pain, unspecified: Secondary | ICD-10-CM | POA: Diagnosis not present

## 2019-11-19 DIAGNOSIS — I5022 Chronic systolic (congestive) heart failure: Secondary | ICD-10-CM | POA: Diagnosis not present

## 2019-11-19 DIAGNOSIS — N179 Acute kidney failure, unspecified: Secondary | ICD-10-CM | POA: Diagnosis not present

## 2019-11-19 DIAGNOSIS — R911 Solitary pulmonary nodule: Secondary | ICD-10-CM | POA: Diagnosis not present

## 2019-11-19 DIAGNOSIS — E871 Hypo-osmolality and hyponatremia: Secondary | ICD-10-CM | POA: Diagnosis not present

## 2019-11-19 DIAGNOSIS — R0600 Dyspnea, unspecified: Secondary | ICD-10-CM | POA: Diagnosis not present

## 2019-11-19 DIAGNOSIS — R918 Other nonspecific abnormal finding of lung field: Secondary | ICD-10-CM | POA: Diagnosis not present

## 2019-11-19 DIAGNOSIS — I444 Left anterior fascicular block: Secondary | ICD-10-CM | POA: Diagnosis not present

## 2019-11-19 DIAGNOSIS — Z20822 Contact with and (suspected) exposure to covid-19: Secondary | ICD-10-CM | POA: Diagnosis not present

## 2019-11-19 DIAGNOSIS — K219 Gastro-esophageal reflux disease without esophagitis: Secondary | ICD-10-CM | POA: Diagnosis not present

## 2019-11-19 DIAGNOSIS — I2584 Coronary atherosclerosis due to calcified coronary lesion: Secondary | ICD-10-CM | POA: Diagnosis not present

## 2019-11-19 DIAGNOSIS — Z7951 Long term (current) use of inhaled steroids: Secondary | ICD-10-CM | POA: Diagnosis not present

## 2019-11-19 DIAGNOSIS — J441 Chronic obstructive pulmonary disease with (acute) exacerbation: Secondary | ICD-10-CM | POA: Diagnosis not present

## 2019-11-19 DIAGNOSIS — M8580 Other specified disorders of bone density and structure, unspecified site: Secondary | ICD-10-CM | POA: Diagnosis not present

## 2019-11-19 DIAGNOSIS — G4733 Obstructive sleep apnea (adult) (pediatric): Secondary | ICD-10-CM | POA: Diagnosis not present

## 2019-11-19 DIAGNOSIS — E785 Hyperlipidemia, unspecified: Secondary | ICD-10-CM | POA: Diagnosis not present

## 2019-11-19 DIAGNOSIS — I2510 Atherosclerotic heart disease of native coronary artery without angina pectoris: Secondary | ICD-10-CM | POA: Diagnosis not present

## 2019-11-19 DIAGNOSIS — Z7409 Other reduced mobility: Secondary | ICD-10-CM | POA: Diagnosis not present

## 2019-11-19 DIAGNOSIS — S51011A Laceration without foreign body of right elbow, initial encounter: Secondary | ICD-10-CM | POA: Diagnosis not present

## 2019-11-19 DIAGNOSIS — I252 Old myocardial infarction: Secondary | ICD-10-CM | POA: Diagnosis not present

## 2019-11-19 DIAGNOSIS — Z955 Presence of coronary angioplasty implant and graft: Secondary | ICD-10-CM | POA: Diagnosis not present

## 2019-11-19 DIAGNOSIS — I110 Hypertensive heart disease with heart failure: Secondary | ICD-10-CM | POA: Diagnosis not present

## 2019-11-19 DIAGNOSIS — I712 Thoracic aortic aneurysm, without rupture: Secondary | ICD-10-CM | POA: Diagnosis not present

## 2019-11-19 DIAGNOSIS — R0989 Other specified symptoms and signs involving the circulatory and respiratory systems: Secondary | ICD-10-CM | POA: Diagnosis not present

## 2019-11-19 DIAGNOSIS — Z794 Long term (current) use of insulin: Secondary | ICD-10-CM | POA: Diagnosis not present

## 2019-11-20 DIAGNOSIS — R911 Solitary pulmonary nodule: Secondary | ICD-10-CM | POA: Diagnosis not present

## 2019-11-20 DIAGNOSIS — Z955 Presence of coronary angioplasty implant and graft: Secondary | ICD-10-CM | POA: Diagnosis not present

## 2019-11-20 DIAGNOSIS — I252 Old myocardial infarction: Secondary | ICD-10-CM | POA: Diagnosis not present

## 2019-11-20 DIAGNOSIS — E1169 Type 2 diabetes mellitus with other specified complication: Secondary | ICD-10-CM | POA: Diagnosis not present

## 2019-11-20 DIAGNOSIS — I110 Hypertensive heart disease with heart failure: Secondary | ICD-10-CM | POA: Diagnosis not present

## 2019-11-20 DIAGNOSIS — S51011A Laceration without foreign body of right elbow, initial encounter: Secondary | ICD-10-CM | POA: Diagnosis not present

## 2019-11-20 DIAGNOSIS — G4733 Obstructive sleep apnea (adult) (pediatric): Secondary | ICD-10-CM | POA: Diagnosis not present

## 2019-11-20 DIAGNOSIS — I2510 Atherosclerotic heart disease of native coronary artery without angina pectoris: Secondary | ICD-10-CM | POA: Diagnosis not present

## 2019-11-20 DIAGNOSIS — K219 Gastro-esophageal reflux disease without esophagitis: Secondary | ICD-10-CM | POA: Diagnosis not present

## 2019-11-20 DIAGNOSIS — I712 Thoracic aortic aneurysm, without rupture: Secondary | ICD-10-CM | POA: Diagnosis not present

## 2019-11-20 DIAGNOSIS — N179 Acute kidney failure, unspecified: Secondary | ICD-10-CM | POA: Diagnosis not present

## 2019-11-20 DIAGNOSIS — I5022 Chronic systolic (congestive) heart failure: Secondary | ICD-10-CM | POA: Diagnosis not present

## 2019-11-20 DIAGNOSIS — Z7951 Long term (current) use of inhaled steroids: Secondary | ICD-10-CM | POA: Diagnosis not present

## 2019-11-20 DIAGNOSIS — Z794 Long term (current) use of insulin: Secondary | ICD-10-CM | POA: Diagnosis not present

## 2019-11-20 DIAGNOSIS — E785 Hyperlipidemia, unspecified: Secondary | ICD-10-CM | POA: Diagnosis not present

## 2019-11-20 DIAGNOSIS — I444 Left anterior fascicular block: Secondary | ICD-10-CM | POA: Diagnosis not present

## 2019-11-20 DIAGNOSIS — Z20822 Contact with and (suspected) exposure to covid-19: Secondary | ICD-10-CM | POA: Diagnosis not present

## 2019-11-20 DIAGNOSIS — Z7409 Other reduced mobility: Secondary | ICD-10-CM | POA: Diagnosis not present

## 2019-11-20 DIAGNOSIS — E871 Hypo-osmolality and hyponatremia: Secondary | ICD-10-CM | POA: Diagnosis not present

## 2019-11-20 DIAGNOSIS — J441 Chronic obstructive pulmonary disease with (acute) exacerbation: Secondary | ICD-10-CM | POA: Diagnosis not present

## 2019-11-20 DIAGNOSIS — M8580 Other specified disorders of bone density and structure, unspecified site: Secondary | ICD-10-CM | POA: Diagnosis not present

## 2019-11-20 DIAGNOSIS — R0989 Other specified symptoms and signs involving the circulatory and respiratory systems: Secondary | ICD-10-CM | POA: Diagnosis not present

## 2019-11-21 DIAGNOSIS — R9431 Abnormal electrocardiogram [ECG] [EKG]: Secondary | ICD-10-CM | POA: Insufficient documentation

## 2019-11-22 DIAGNOSIS — F17210 Nicotine dependence, cigarettes, uncomplicated: Secondary | ICD-10-CM | POA: Diagnosis not present

## 2019-11-22 DIAGNOSIS — E871 Hypo-osmolality and hyponatremia: Secondary | ICD-10-CM | POA: Diagnosis not present

## 2019-11-22 DIAGNOSIS — J441 Chronic obstructive pulmonary disease with (acute) exacerbation: Secondary | ICD-10-CM | POA: Diagnosis not present

## 2019-12-21 DIAGNOSIS — I152 Hypertension secondary to endocrine disorders: Secondary | ICD-10-CM | POA: Diagnosis not present

## 2019-12-21 DIAGNOSIS — E1159 Type 2 diabetes mellitus with other circulatory complications: Secondary | ICD-10-CM | POA: Diagnosis not present

## 2019-12-21 DIAGNOSIS — M79641 Pain in right hand: Secondary | ICD-10-CM | POA: Diagnosis not present

## 2019-12-21 DIAGNOSIS — M1811 Unilateral primary osteoarthritis of first carpometacarpal joint, right hand: Secondary | ICD-10-CM | POA: Diagnosis not present

## 2020-02-10 ENCOUNTER — Ambulatory Visit: Payer: Medicare Other | Admitting: Orthopaedic Surgery

## 2020-02-22 ENCOUNTER — Ambulatory Visit: Payer: Self-pay

## 2020-02-22 ENCOUNTER — Other Ambulatory Visit: Payer: Self-pay

## 2020-02-22 ENCOUNTER — Ambulatory Visit (INDEPENDENT_AMBULATORY_CARE_PROVIDER_SITE_OTHER): Payer: Medicare Other | Admitting: Physician Assistant

## 2020-02-22 ENCOUNTER — Ambulatory Visit: Payer: Medicare Other | Admitting: Orthopaedic Surgery

## 2020-02-22 ENCOUNTER — Encounter: Payer: Self-pay | Admitting: Physician Assistant

## 2020-02-22 DIAGNOSIS — G8929 Other chronic pain: Secondary | ICD-10-CM

## 2020-02-22 DIAGNOSIS — M25512 Pain in left shoulder: Secondary | ICD-10-CM | POA: Diagnosis not present

## 2020-02-22 DIAGNOSIS — M25511 Pain in right shoulder: Secondary | ICD-10-CM | POA: Diagnosis not present

## 2020-02-22 DIAGNOSIS — M79641 Pain in right hand: Secondary | ICD-10-CM | POA: Diagnosis not present

## 2020-02-22 DIAGNOSIS — M25521 Pain in right elbow: Secondary | ICD-10-CM

## 2020-02-22 NOTE — Progress Notes (Signed)
Office Visit Note   Patient: Chad Cameron           Date of Birth: 08-29-1931           MRN: [id number] Visit Date: 02/22/2020              Requested by: Christella Scheuermann, PA-C [address] [place],  [place] [id number] PCP: Christella Scheuermann, PA-C   Assessment & Plan: Visit Diagnoses:  1. Pain in right hand   2. Pain in right elbow   3. Chronic right shoulder pain   4. Chronic left shoulder pain     Plan: We will see how he does with the injections in both shoulders today he understands to watch his glucose levels.  In regards to the elbow and hand recommend Voltaren gel up to 2 g 4 times daily to both areas.  He will follow up with [place] as needed.  Questions encouraged and answered  Follow-Up Instructions: Return if symptoms worsen or fail to improve.   Orders:  Orders Placed This Encounter  Procedures  . XR Hand Complete Right  . XR Elbow 2 Views Right   No orders of the defined types were placed in this encounter.     Procedures: No procedures performed   Clinical Data: No additional findings.   Subjective: Chief Complaint  Patient presents with  . Right Shoulder - Pain  . Left Shoulder - Pain    HPI Mr. Chad Cameron is well-known to Dr. Magnus Ivan service comes in today with chronic bilateral shoulder pain.  Last injections were 12/24/2018 he is asking for injections in both shoulders today.  He is also asking for [place] to look at his right shoulder and right elbow.  He states he was seen at the [place] where x-rays were obtained and these showed no acute fractures.  However he continues to have pain now 6 to 7 weeks status post fall.  Has been taking Aleve for the pain in the wrist hands shoulders which he states is really not helping.  Patient is diabetic reports his hemoglobin A1c was under 6.  Review of Systems Denies any fevers chills or recent vaccines.  Objective: Vital Signs: There were no vitals taken for this visit.  Physical Exam Constitutional:      Appearance:  He is not ill-appearing or diaphoretic.  Pulmonary:     Effort: Pulmonary effort is normal.  Neurological:     Mental Status: He is alert.     Ortho Exam Right elbow excellent range of motion.  He has full supination pronation forearm.  Tenderness just proximal to the medial epicondyle region.  There is no rashes skin lesions ulcerations or edema about the right elbow. Right wrist hand no rashes skin lesions ulcerations.  There is dorsal bossing over the hamate region.  He has full range of motion of the fingers full sensation full motor right hand. Specialty Comments:  No specialty comments available.  Imaging: XR Elbow 2 Views Right  Result Date: 02/22/2020 Right elbow 2 views: Elbow is well located no acute fractures no bony abnormalities.  No significant arthritic changes noted.  XR Hand Complete Right  Result Date: 02/22/2020 Right hand and wrist 3 views: No acute fractures.  There is osteophyte over the hamate region best seen on the lateral view.  No other bony abnormalities.    PMFS History: Patient Active Problem List   Diagnosis Date Noted  . QT prolongation 11/21/2019  . Hyponatremia 11/16/2019  . Fall 11/15/2019  .  Pulmonary nodule 11/15/2019  . Ischemic stroke (HCC) 06/01/2019  . Chronic systolic congestive heart failure (HCC) 04/21/2019  . Essential hypertension 04/21/2019  . Ischemic cardiomyopathy 04/21/2019  . Sensorineural hearing loss (SNHL) of both ears 04/14/2019  . Acute heart failure (HCC) 03/22/2019  . Esophageal dysmotility 01/13/2019  . Osteoporosis 01/13/2019  . Status post total replacement of right hip 01/28/2018  . Unilateral primary osteoarthritis, right hip 01/01/2018  . Closed compression fracture of thoracic vertebra (HCC) 12/11/2017  . Peripheral neuropathy 12/05/2017  . GAD (generalized anxiety disorder) 07/19/2017  . Moderate episode of recurrent major depressive disorder (HCC) 07/19/2017  . Nicotine dependence, uncomplicated  07/05/2017  . Decreased pulses in feet 01/24/2017  . S/P lumbar laminectomy 11/02/2016  . 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 Aortic root dilatation (HCC) 02/10/2014  . DJD (degenerative joint disease) of knee 03/05/2013  . Left knee pain 03/05/2013  . Anxiety 03/03/2012  . BPH (benign prostatic hyperplasia) 03/03/2012  . Chronic coronary artery disease 03/03/2012  . CKD (chronic kidney disease) 03/03/2012  . COPD (chronic obstructive pulmonary disease) (HCC) 03/03/2012  . Dementia (HCC) 03/03/2012  . Depression 03/03/2012  . Dyslipidemia 03/03/2012  . Fibromyalgia 03/03/2012  . Headache(784.0) 03/03/2012  . Hyperglycemia 03/03/2012  . Hyperlipidemia 03/03/2012  . Sleep apnea 03/03/2012  . Syncope 03/03/2012  . Thoracic aortic aneurysm (HCC) 03/03/2012   Past Medical History:  Diagnosis Date  . Anxiety   . Aortic aneurysm (HCC)   . Arthritis   . COPD (chronic obstructive pulmonary disease) (HCC)   . Coronary artery disease   . Dementia [facility])    " my memory is about gone"  . Depression   . Diabetes mellitus without complication (HCC)    Type II  . Dyspnea    with exertion  . GERD (gastroesophageal reflux disease) 01/30/2018  . Headache   . History of kidney stones     numerous, passed them  . Hypertension   . Neuromuscular disorder (HCC)    diabetic neuropathy  . Pneumonia 01/30/2018    x 2  . Schizophrenia (HCC)    " not had a problem with you."    Family History  Problem Relation Age of Onset  . Parkinson's disease Mother   . Ulcers Father   . Cancer - Other Father     Past Surgical History:  Procedure Laterality Date  . ADENOIDECTOMY    . CARDIAC CATHETERIZATION    . CATARACT EXTRACTION W/ INTRAOCULAR LENS IMPLANT Bilateral   . CHOLECYSTECTOMY    . COLONOSCOPY    . FOOT FRACTURE SURGERY Right   . LUMBAR LAMINECTOMY/DECOMPRESSION MICRODISCECTOMY Right 11/02/2016   Procedure: Laminectomy and Foraminotomy  - Lumbar five-Sacral one - right;  Surgeon: Tia Alert, MD;  Location: [facility] OR;  Service: Neurosurgery;  Laterality: Right;  . TONSILLECTOMY    . TOTAL HIP ARTHROPLASTY Right 01/28/2018   Procedure: RIGHT TOTAL HIP ARTHROPLASTY ANTERIOR APPROACH;  Surgeon: Kathryne Hitch, MD;  Location: [facility] OR;  Service: Orthopedics;  Laterality: Right;  Marland Kitchen VASCULAR SURGERY Right    "Vein striped"   Social History   Occupational History  . Not on file  Tobacco Use  . Smoking status: Current Every Day Smoker    Years: 76.00    Types: Cigars  . Smokeless tobacco: Never Used  . Tobacco comment: little cigars - 10 a day  Vaping Use  . Vaping Use: Never used  Substance and Sexual Activity  . Alcohol  use: Yes    Alcohol/week: 7.0 standard drinks    Types: 7 Shots of liquor per week  . Drug use: No  . Sexual activity: Not on file

## 2020-06-16 ENCOUNTER — Other Ambulatory Visit: Payer: Self-pay | Admitting: *Deleted

## 2020-06-16 ENCOUNTER — Encounter: Payer: Self-pay | Admitting: *Deleted

## 2020-06-16 NOTE — Patient Outreach (Signed)
[facility]) Care Management  06/16/2020  Chad Cameron June 28, 1931 [id number]   Telephone Screen  Referral Date : 06/13/20 Referral Source: Insurance Plan Referral Reason: Assess for Complex Case Management Needs  Insurance: [employer], [place]   Outreach Attempt #1 Subjective: Successful outreach call to patient , he reports doing okay of today.Explained reason for the call today and [facility] care management services. Patient reports having his assistant [name]  from [facility] with him at this time ,he refers to her as his angel. Patient is agreeable to screening call at this time and request that Sophia helps with assessment.   Social : Patient reports living independently in an apartment. He states that apartment has handicap safety equipment. He reports being independent with daily personal care. He reports cooking his meals at home when he does not go out to eat. He reports having personal care assistant that visits a few days a week. He has a nurse that visit each week to fill his pill box. He has an assist from [employer] with visit weekly to assist in home, transportation and attending medical appointments to [place].  Patient is also followed by [facility] , home program with Nurse Practitioner as benefit of [employer] care .   Patient reports using a walker when he travels outside the home.  Patient reports using benefit from [place] [facility]  for [employer], for shopping, his [facility] assistant helps patient with over the counter ordering to [employer] each month.   Conditions Patient discussed history of Heart Failure, he has scales for monitoring weight at home, denies worsening symptoms of shortness of breath, sudden weight gain or swelling. He reports history of COPD, denies cough or worsening shortness of breath, he reports having albuterol inhaler use at least each day, carries with him when out of home. He reports smoking cigars daily and not going to  quit. He reports having history of Diabetes, has meter to monitor but does not check it , states nurse that visits weekly checks it and it has been okay around 130 recalls last check. He denies having episodes of low blood sugar reading as symptoms reviewed, states that he know what to if he does. Review steps to treating hypoglycemia.  Patient report nurse checks his blood pressure at visits also.   Medications  Patient reports taking medications as prescribed, nurse checks each week. He denies medication cost concerns gets medication through the [employer].   Appointment  He has appointment  at [facility] on [facility]/23/20 his [facility] assistant  [name] will provide transportation and attend visit with him.   Advanced Directive Patient does not have and Advanced directive , he is interested in receiving information handout.    Consent  Explained and offered [facility] care management for education and support of chronic conditions ,  patient states that he is unsure if he needs additional persons contacting has he already has so many people checking each week.  He is agreeable to follow up call in the next 2 week regarding agreeing to  [facility] care management services.   Plan Will send patient  successful outreach letter  Will send patient Advanced Directive packet and education handout on Advanced Directive.  Will plan return call in the next 2 weeks as patient agreeable.    [name], RN, BSN  [facility] Care Management,Care Management Coordinator  [phone]- Mobile [phone]- Toll Free Main Office

## 2020-06-16 NOTE — Patient Instructions (Signed)
Critical care medicine: Principles of diagnosis and management in the adult (4th ed., pp. 1251-1270). Saunders."> Miller's anesthesia (8th ed., pp. 232-250). Saunders.">  Advance Directive  Advance directives are legal documents that allow you to make decisions about your health care and medical treatment in case you become unable to communicate for yourself. Advance directives let your wishes be known to family, friends, and health care providers. Discussing and writing advance directives should happen over time rather than all at once. Advance directives can be changed and updated at any time. There are different types of advance directives, such as:  Medical power of attorney.  Living will.  Do not resuscitate (DNR) order or do not attempt resuscitation (DNAR) order. Health care proxy and medical power of attorney A health care proxy is also called a health care agent. This person is appointed to make medical decisions for you when you are unable to make decisions for yourself. Generally, people ask a trusted friend or family member to act as their proxy and represent their preferences. Make sure you have an agreement with your trusted person to act as your proxy. A proxy may have to make a medical decision on your behalf if your wishes are not known. A medical power of attorney, also called a durable power of attorney for health care, is a legal document that names your health care proxy. Depending on the laws in your state, the document may need to be:  Signed.  Notarized.  Dated.  Copied.  Witnessed.  Incorporated into your medical record. You may also want to appoint a trusted person to manage your money in the event you are unable to do so. This is called a durable power of attorney for finances. It is a separate legal document from the durable power of attorney for health care. You may choose your health care proxy or someone different to act as your agent in money matters. If you  do not appoint a proxy, or there is a concern that the proxy is not acting in your best interest, a court may appoint a guardian to act on your behalf. Living will A living will is a set of instructions that state your wishes about medical care when you cannot express them yourself. Health care providers should keep a copy of your living will in your medical record. You may want to give a copy to family members or friends. To alert caregivers in case of an emergency, you can place a card in your wallet to let them know that you have a living will and where they can find it. A living will is used if you become:  Terminally ill.  Disabled.  Unable to communicate or make decisions. The following decisions should be included in your living will:  To use or not to use life support equipment, such as dialysis machines and breathing machines (ventilators).  Whether you want a DNR or DNAR order. This tells health care providers not to use cardiopulmonary resuscitation (CPR) if breathing or heartbeat stops.  To use or not to use tube feeding.  To be given or not to be given food and fluids.  Whether you want comfort (palliative) care when the goal becomes comfort rather than a cure.  Whether you want to donate your organs and tissues. A living will does not give instructions for distributing your money and property if you should pass away. DNR or DNAR A DNR or DNAR order is a request not to have CPR in   the event that your heart stops beating or you stop breathing. If a DNR or DNAR order has not been made and shared, a health care provider will try to help any patient whose heart has stopped or who has stopped breathing. If you plan to have surgery, talk with your health care provider about how your DNR or DNAR order will be followed if problems occur. What if I do not have an advance directive? Some states assign family decision makers to act on your behalf if you do not have an advance directive.  Each state has its own laws about advance directives. You may want to check with your health care provider, attorney, or state representative about the laws in your state. Summary  Advance directives are legal documents that allow you to make decisions about your health care and medical treatment in case you become unable to communicate for yourself.  The process of discussing and writing advance directives should happen over time. You can change and update advance directives at any time.  Advance directives may include a medical power of attorney, a living will, and a DNR or DNAR order. This information is not intended to replace advice given to you by your health care provider. Make sure you discuss any questions you have with your health care provider. Document Revised: 12/15/2019 Document Reviewed: 12/15/2019 Elsevier Patient Education  2021 Elsevier Inc.  

## 2020-07-05 ENCOUNTER — Other Ambulatory Visit: Payer: Self-pay | Admitting: *Deleted

## 2020-07-05 NOTE — Patient Outreach (Signed)
[facility]) Care Management  07/05/2020  Chad Cameron [birth year] [id number]   Telephone Screen  Referral Date : 06/13/20 Referral Source: Insurance Plan Referral Reason: Assess for Complex Case Management Needs  Insurance: [employer], [place]   Outreach Attempt #2 Subjective: Outreach call to patient, states he is currently at the grocery store request later call today.   [phone]  Returned call to patient , reintroduced self, [facility] care management services and reason for the call, [facility] care management services reviewed  Patient is able to recall recent outreach. Begin to discuss [facility] care management service  He states that he is has enough people calling and visiting him each month. He began to discuss the  weekly visits from nurse to fill pill box, personal care assistant a few days a week, the [facility] program(home based medical program) with nurse visit once monthly per patient.   He declines additional outreach calls support  at this time    Plan Will plan case closure to [facility] care management services.    [name], RN, BSN  [facility] Care Management,Care Management Coordinator  [phone]- Mobile [phone]- Toll Free Main Office

## 2020-12-28 DIAGNOSIS — E860 Dehydration: Secondary | ICD-10-CM | POA: Diagnosis not present

## 2020-12-28 DIAGNOSIS — R739 Hyperglycemia, unspecified: Secondary | ICD-10-CM | POA: Diagnosis not present

## 2020-12-28 DIAGNOSIS — R262 Difficulty in walking, not elsewhere classified: Secondary | ICD-10-CM | POA: Diagnosis not present

## 2020-12-28 DIAGNOSIS — Z743 Need for continuous supervision: Secondary | ICD-10-CM | POA: Diagnosis not present

## 2020-12-28 DIAGNOSIS — R6889 Other general symptoms and signs: Secondary | ICD-10-CM | POA: Diagnosis not present

## 2021-08-24 DEATH — deceased
